# Patient Record
Sex: Female | Born: 1955 | Race: White | Hispanic: No | Marital: Married | Smoking: Never smoker
Health system: Southern US, Community
[De-identification: ages and names within clinical notes are randomized; demographics above are authoritative.]

## PROBLEM LIST (undated history)

## (undated) DIAGNOSIS — B019 Varicella without complication: Secondary | ICD-10-CM

## (undated) DIAGNOSIS — M1990 Unspecified osteoarthritis, unspecified site: Secondary | ICD-10-CM

## (undated) HISTORY — PX: MOUTH SURGERY: SHX715

## (undated) HISTORY — DX: Varicella without complication: B01.9

## (undated) HISTORY — DX: Unspecified osteoarthritis, unspecified site: M19.90

## (undated) HISTORY — PX: TUBAL LIGATION: SHX77

---

## 1997-07-03 ENCOUNTER — Other Ambulatory Visit: Admission: RE | Admit: 1997-07-03 | Discharge: 1997-07-03 | Payer: Self-pay | Admitting: *Deleted

## 1999-08-05 ENCOUNTER — Encounter: Payer: Self-pay | Admitting: *Deleted

## 1999-08-05 ENCOUNTER — Encounter: Admission: RE | Admit: 1999-08-05 | Discharge: 1999-08-05 | Payer: Self-pay | Admitting: *Deleted

## 1999-10-04 ENCOUNTER — Other Ambulatory Visit: Admission: RE | Admit: 1999-10-04 | Discharge: 1999-10-04 | Payer: Self-pay | Admitting: *Deleted

## 2000-08-09 ENCOUNTER — Encounter: Admission: RE | Admit: 2000-08-09 | Discharge: 2000-08-09 | Payer: Self-pay | Admitting: *Deleted

## 2000-08-09 ENCOUNTER — Encounter: Payer: Self-pay | Admitting: *Deleted

## 2001-01-28 ENCOUNTER — Other Ambulatory Visit: Admission: RE | Admit: 2001-01-28 | Discharge: 2001-01-28 | Payer: Self-pay | Admitting: *Deleted

## 2001-11-19 ENCOUNTER — Encounter: Admission: RE | Admit: 2001-11-19 | Discharge: 2001-11-19 | Payer: Self-pay | Admitting: *Deleted

## 2001-11-19 ENCOUNTER — Encounter: Payer: Self-pay | Admitting: *Deleted

## 2003-01-26 ENCOUNTER — Other Ambulatory Visit: Admission: RE | Admit: 2003-01-26 | Discharge: 2003-01-26 | Payer: Self-pay | Admitting: Obstetrics and Gynecology

## 2003-01-26 ENCOUNTER — Encounter: Admission: RE | Admit: 2003-01-26 | Discharge: 2003-01-26 | Payer: Self-pay | Admitting: Obstetrics and Gynecology

## 2004-04-18 ENCOUNTER — Encounter: Admission: RE | Admit: 2004-04-18 | Discharge: 2004-04-18 | Payer: Self-pay | Admitting: Obstetrics and Gynecology

## 2005-06-22 ENCOUNTER — Encounter: Admission: RE | Admit: 2005-06-22 | Discharge: 2005-06-22 | Payer: Self-pay | Admitting: Obstetrics and Gynecology

## 2006-06-27 ENCOUNTER — Encounter: Admission: RE | Admit: 2006-06-27 | Discharge: 2006-06-27 | Payer: Self-pay | Admitting: Obstetrics and Gynecology

## 2007-01-23 ENCOUNTER — Emergency Department (HOSPITAL_COMMUNITY): Admission: EM | Admit: 2007-01-23 | Discharge: 2007-01-23 | Payer: Self-pay | Admitting: Emergency Medicine

## 2007-07-01 ENCOUNTER — Encounter: Admission: RE | Admit: 2007-07-01 | Discharge: 2007-07-01 | Payer: Self-pay | Admitting: Obstetrics and Gynecology

## 2008-07-07 ENCOUNTER — Encounter: Admission: RE | Admit: 2008-07-07 | Discharge: 2008-07-07 | Payer: Self-pay | Admitting: Obstetrics and Gynecology

## 2009-07-15 ENCOUNTER — Encounter: Admission: RE | Admit: 2009-07-15 | Discharge: 2009-07-15 | Payer: Self-pay | Admitting: Obstetrics and Gynecology

## 2010-04-14 ENCOUNTER — Encounter: Payer: Self-pay | Admitting: Family Medicine

## 2010-04-25 ENCOUNTER — Encounter: Payer: Self-pay | Admitting: Family Medicine

## 2010-07-06 ENCOUNTER — Other Ambulatory Visit: Payer: Self-pay | Admitting: Obstetrics

## 2010-07-06 DIAGNOSIS — Z1231 Encounter for screening mammogram for malignant neoplasm of breast: Secondary | ICD-10-CM

## 2010-07-19 ENCOUNTER — Ambulatory Visit
Admission: RE | Admit: 2010-07-19 | Discharge: 2010-07-19 | Disposition: A | Discharge status: Home / Self Care | Payer: BC Managed Care – PPO | Source: Ambulatory Visit | Attending: Obstetrics | Admitting: Obstetrics

## 2010-07-19 DIAGNOSIS — Z1231 Encounter for screening mammogram for malignant neoplasm of breast: Secondary | ICD-10-CM

## 2010-09-29 LAB — DIFFERENTIAL
Basophils Absolute: 0.1
Basophils Relative: 1
Eosinophils Absolute: 0
Eosinophils Relative: 1
Monocytes Absolute: 0.7
Monocytes Relative: 9
Neutrophils Relative %: 65

## 2010-09-29 LAB — CBC
HCT: 41.2
MCHC: 33.6
MCV: 93.1
Platelets: 410 — ABNORMAL HIGH

## 2010-09-29 LAB — I-STAT 8, (EC8 V) (CONVERTED LAB)
Bicarbonate: 25 — ABNORMAL HIGH
HCT: 45
Operator id: 294501
Potassium: 3.3 — ABNORMAL LOW
Sodium: 138
TCO2: 27
pCO2, Ven: 51.6 — ABNORMAL HIGH
pH, Ven: 7.293

## 2011-06-19 ENCOUNTER — Other Ambulatory Visit: Payer: Self-pay | Admitting: Obstetrics

## 2011-06-19 DIAGNOSIS — Z1231 Encounter for screening mammogram for malignant neoplasm of breast: Secondary | ICD-10-CM

## 2011-07-20 ENCOUNTER — Ambulatory Visit
Admission: RE | Admit: 2011-07-20 | Discharge: 2011-07-20 | Disposition: A | Discharge status: Home / Self Care | Payer: BC Managed Care – PPO | Source: Ambulatory Visit | Attending: Obstetrics | Admitting: Obstetrics

## 2011-07-20 DIAGNOSIS — Z1231 Encounter for screening mammogram for malignant neoplasm of breast: Secondary | ICD-10-CM

## 2011-07-22 LAB — HM MAMMOGRAPHY: HM Mammogram: NEGATIVE

## 2011-11-30 ENCOUNTER — Ambulatory Visit: Payer: BC Managed Care – PPO | Admitting: Family Medicine

## 2012-01-10 HISTORY — PX: BREAST BIOPSY: SHX20

## 2012-01-22 ENCOUNTER — Ambulatory Visit (INDEPENDENT_AMBULATORY_CARE_PROVIDER_SITE_OTHER): Payer: BC Managed Care – PPO | Admitting: Family Medicine

## 2012-01-22 ENCOUNTER — Encounter: Payer: Self-pay | Admitting: Family Medicine

## 2012-01-22 VITALS — BP 120/72 | HR 72 | Temp 98.5°F | Resp 12 | Ht 63.25 in | Wt 174.0 lb

## 2012-01-22 DIAGNOSIS — I7300 Raynaud's syndrome without gangrene: Secondary | ICD-10-CM | POA: Insufficient documentation

## 2012-01-22 DIAGNOSIS — Z0000 Encounter for general adult medical examination without abnormal findings: Secondary | ICD-10-CM

## 2012-01-22 DIAGNOSIS — M47812 Spondylosis without myelopathy or radiculopathy, cervical region: Secondary | ICD-10-CM | POA: Insufficient documentation

## 2012-01-22 LAB — BASIC METABOLIC PANEL
Chloride: 104 mEq/L (ref 96–112)
Creatinine, Ser: 0.7 mg/dL (ref 0.4–1.2)
GFR: 90.35 mL/min (ref >60.00)
Sodium: 139 mEq/L (ref 135–145)

## 2012-01-22 LAB — HEPATIC FUNCTION PANEL
ALT: 13 U/L (ref 0–35)
Alkaline Phosphatase: 62 U/L (ref 39–117)
Total Bilirubin: 0.5 mg/dL (ref 0.3–1.2)

## 2012-01-22 LAB — CBC WITH DIFFERENTIAL/PLATELET
Basophils Absolute: 0.1 10*3/uL (ref 0.0–0.1)
Basophils Relative: 1.7 % (ref 0.0–3.0)
Eosinophils Absolute: 0.1 10*3/uL (ref 0.0–0.7)
Eosinophils Relative: 0.9 % (ref 0.0–5.0)
Lymphs Abs: 1.6 10*3/uL (ref 0.7–4.0)
MCV: 93.7 fl (ref 78.0–100.0)
Monocytes Relative: 10.2 % (ref 3.0–12.0)
RDW: 12.8 % (ref 11.5–14.6)

## 2012-01-22 LAB — LIPID PANEL
Cholesterol: 188 mg/dL (ref 0–200)
Total CHOL/HDL Ratio: 4
VLDL: 28.6 mg/dL (ref 0.0–40.0)

## 2012-01-22 LAB — TSH: TSH: 2.35 u[IU]/mL (ref 0.35–5.50)

## 2012-01-22 NOTE — Patient Instructions (Addendum)
Consider check on insurance coverage for shingles vaccine

## 2012-01-22 NOTE — Progress Notes (Signed)
  Subjective:    Patient ID: Donna Shaw, female    DOB: 1955-07-19, 57 y.o.   MRN: [id number]  HPI Patient here to establish care. She sees gynecologist for yearly exam. 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 Has had one month history of some right sacroiliac pains. No injury. Described as dull ache sometimes worse at night. No radiculopathy symptoms. No pain with walking. Ibuprofen helps. Denies low back pain.  Intermittent tingling sensation upper extremities intermittently. Positional and resolves after 1 minute or less with position change. No weakness.  Patient's had bilateral tubal ligation no other surgeries. She works as a Ecologist. No history of smoking. Only occasional alcohol use.  Patient has questions regarding shingles vaccine. She had chickenpox as a child.   Review of Systems  Constitutional: Negative for fever, activity change, appetite change, fatigue and unexpected weight change.  HENT: Negative for hearing loss, ear pain, sore throat and trouble swallowing.   Eyes: Negative for visual disturbance.  Respiratory: Negative for cough and shortness of breath.   Cardiovascular: Negative for chest pain and palpitations.  Gastrointestinal: Negative for abdominal pain, diarrhea, constipation and blood in stool.  Genitourinary: Negative for dysuria and hematuria.  Musculoskeletal: Negative for myalgias, back pain and arthralgias.  Skin: Negative for rash.  Neurological: Negative for dizziness, syncope and headaches.  Hematological: Negative for adenopathy.  Psychiatric/Behavioral: Negative for confusion and dysphoric mood.       Objective:   Physical Exam  Constitutional: She is oriented to person, place, and time. She appears well-developed and well-nourished.  HENT:  Head: Normocephalic and atraumatic.  Eyes: EOM are normal. Pupils are equal, round, and reactive to light.  Neck: Normal range of motion. Neck supple. No  thyromegaly present.  Cardiovascular: Normal rate, regular rhythm and normal heart sounds.   No murmur heard. Pulmonary/Chest: Breath sounds normal. No respiratory distress. She has no wheezes. She has no rales.  Abdominal: Soft. Bowel sounds are normal. She exhibits no distension and no mass. There is no tenderness. There is no rebound and no guarding.  Musculoskeletal: Normal range of motion. She exhibits no edema.  Lymphadenopathy:    She has no cervical adenopathy.  Neurological: She is alert and oriented to person, place, and time. She displays normal reflexes. No cranial nerve deficit.  Skin: No rash noted.  Psychiatric: She has a normal mood and affect. Her behavior is normal. Judgment and thought content normal.          Assessment & Plan:  Health maintenance. Obtain screening lab work. Check on insurance coverage for shingles vaccine. Continue yearly mammogram. She'll continue yearly GYN followup

## 2012-01-23 NOTE — Progress Notes (Signed)
Quick Note:  Pt informed on VM ______ 

## 2012-04-05 ENCOUNTER — Encounter: Payer: Self-pay | Admitting: Family Medicine

## 2012-04-05 ENCOUNTER — Ambulatory Visit (INDEPENDENT_AMBULATORY_CARE_PROVIDER_SITE_OTHER): Payer: BC Managed Care – PPO | Admitting: Family Medicine

## 2012-04-05 VITALS — BP 102/60 | HR 78 | Temp 98.1°F | Wt 177.0 lb

## 2012-04-05 DIAGNOSIS — H6120 Impacted cerumen, unspecified ear: Secondary | ICD-10-CM

## 2012-04-05 DIAGNOSIS — H6123 Impacted cerumen, bilateral: Secondary | ICD-10-CM

## 2012-04-05 NOTE — Progress Notes (Signed)
  Subjective:    Patient ID: Donna Shaw, female    DOB: 05/08/1955, 57 y.o.   MRN: [id number]  HPI Bilateral ear pressure. Denies any dizziness. No drainage. She's noted some bilateral hearing loss recently. No nasal congestion. She's tried irrigating years at home without improvement in the past. Denies any headaches.  Past Medical History  Diagnosis Date  . Chicken pox   . Arthritis     osteoarthritis neck   Past Surgical History  Procedure Laterality Date  . Tubal ligation      reports that she has never smoked. She does not have any smokeless tobacco history on file. Her alcohol and drug histories are not on file. family history includes Cancer in her father and mother. Allergies  Allergen Reactions  . Codeine     hives      Review of Systems  Constitutional: Negative for fever and chills.  HENT: Positive for hearing loss. Negative for ear pain, congestion and ear discharge.   Neurological: Negative for headaches.       Objective:   Physical Exam  Constitutional: She appears well-developed and well-nourished.  HENT:  Bilateral cerumen impactions. Fully removed after irrigation. Eardrums appear normal  Cardiovascular: Normal rate and regular rhythm.           Assessment & Plan:  Bilateral cerumen impactions. Removed without difficulty. Patient will followup as needed Hearing improved following removal of cerumen

## 2012-04-05 NOTE — Patient Instructions (Addendum)
Cerumen Impaction  A cerumen impaction is when the wax in your ear forms a plug. This plug usually causes reduced hearing. Sometimes it also causes an earache or dizziness. Removing a cerumen impaction can be difficult and painful. The wax sticks to the ear canal. The canal is sensitive and bleeds easily. If you try to remove a heavy wax buildup with a cotton tipped swab, you may push it in further.  Irrigation with water, suction, and small ear curettes may be used to clear out the wax. If the impaction is fixed to the skin in the ear canal, ear drops may be needed for a few days to loosen the wax. People who build up a lot of wax frequently can use ear wax removal products available in your local drugstore.  SEEK MEDICAL CARE IF:    You develop an earache, increased hearing loss, or marked dizziness.  Document Released: 02/03/2004 Document Revised: 03/20/2011 Document Reviewed: 03/25/2009  ExitCare Patient Information 2013 [employer].

## 2012-06-10 ENCOUNTER — Other Ambulatory Visit: Payer: Self-pay

## 2012-06-10 DIAGNOSIS — Z1231 Encounter for screening mammogram for malignant neoplasm of breast: Secondary | ICD-10-CM

## 2012-07-22 ENCOUNTER — Ambulatory Visit
Admission: RE | Admit: 2012-07-22 | Discharge: 2012-07-22 | Disposition: A | Discharge status: Home / Self Care | Payer: BC Managed Care – PPO | Source: Ambulatory Visit

## 2012-07-22 DIAGNOSIS — Z1231 Encounter for screening mammogram for malignant neoplasm of breast: Secondary | ICD-10-CM

## 2012-07-24 ENCOUNTER — Other Ambulatory Visit: Payer: Self-pay | Admitting: Obstetrics

## 2012-07-24 DIAGNOSIS — R928 Other abnormal and inconclusive findings on diagnostic imaging of breast: Secondary | ICD-10-CM

## 2012-08-06 ENCOUNTER — Ambulatory Visit
Admission: RE | Admit: 2012-08-06 | Discharge: 2012-08-06 | Disposition: A | Discharge status: Home / Self Care | Payer: BC Managed Care – PPO | Source: Ambulatory Visit | Attending: Obstetrics | Admitting: Obstetrics

## 2012-08-06 ENCOUNTER — Other Ambulatory Visit: Payer: Self-pay | Admitting: Obstetrics

## 2012-08-06 DIAGNOSIS — N6320 Unspecified lump in the left breast, unspecified quadrant: Secondary | ICD-10-CM

## 2012-08-06 DIAGNOSIS — R928 Other abnormal and inconclusive findings on diagnostic imaging of breast: Secondary | ICD-10-CM

## 2012-08-07 ENCOUNTER — Ambulatory Visit
Admission: RE | Admit: 2012-08-07 | Discharge: 2012-08-07 | Disposition: A | Discharge status: Home / Self Care | Payer: BC Managed Care – PPO | Source: Ambulatory Visit | Attending: Obstetrics | Admitting: Obstetrics

## 2012-08-07 ENCOUNTER — Other Ambulatory Visit: Payer: Self-pay | Admitting: Obstetrics

## 2012-08-07 DIAGNOSIS — N6320 Unspecified lump in the left breast, unspecified quadrant: Secondary | ICD-10-CM

## 2012-10-23 ENCOUNTER — Encounter: Payer: Self-pay | Admitting: Family Medicine

## 2012-10-23 ENCOUNTER — Ambulatory Visit (INDEPENDENT_AMBULATORY_CARE_PROVIDER_SITE_OTHER): Payer: BC Managed Care – PPO | Admitting: Family Medicine

## 2012-10-23 VITALS — BP 106/70 | HR 96 | Temp 98.3°F | Wt 184.0 lb

## 2012-10-23 DIAGNOSIS — Z23 Encounter for immunization: Secondary | ICD-10-CM

## 2012-10-23 DIAGNOSIS — J209 Acute bronchitis, unspecified: Secondary | ICD-10-CM

## 2012-10-23 DIAGNOSIS — Z7189 Other specified counseling: Secondary | ICD-10-CM

## 2012-10-23 DIAGNOSIS — Z7185 Encounter for immunization safety counseling: Secondary | ICD-10-CM

## 2012-10-23 MED ORDER — AZITHROMYCIN 250 MG PO TABS: ORAL_TABLET | ORAL | Status: AC

## 2012-10-23 NOTE — Progress Notes (Signed)
  Subjective:    Patient ID: Donna Shaw, female    DOB: August 28, 1955, 57 y.o.   MRN: [id number]  HPI Patient seen with respiratory illness Onset last week. She's had cough productive of green sputum. She's had some sore throat. Increased malaise. No fever. Intermittent left ear pain. Denies any nausea or vomiting. No sick contacts. She's not aware of any wheezing. She's using NyQuil for nighttime relief of coughing. No history of any asthma or reactive airway problems. Nonsmoker  Requesting flu vaccine.  Patient also has questions regarding mumps immunization status. She states in looking over her old record she cannot confirm any prior mumps vaccination. She states that she had measles clinically. She would like to consider mumps IgG level with her physical labs in January  Past Medical History  Diagnosis Date  . Chicken pox   . Arthritis     osteoarthritis neck   Past Surgical History  Procedure Laterality Date  . Tubal ligation      reports that she has never smoked. She does not have any smokeless tobacco history on file. Her alcohol and drug histories are not on file. family history includes Cancer in her father and mother. Allergies  Allergen Reactions  . Codeine     hives      Review of Systems  Constitutional: Negative for fever and chills.  HENT: Positive for sore throat. Negative for postnasal drip and sinus pressure.   Respiratory: Positive for cough. Negative for shortness of breath and wheezing.   Gastrointestinal: Negative for nausea and vomiting.       Objective:   Physical Exam  Constitutional: She appears well-developed and well-nourished.  HENT:  Right Ear: External ear normal.  Left Ear: External ear normal.  Mouth/Throat: Oropharynx is clear and moist.  Neck: Neck supple. No thyromegaly present.  Cardiovascular: Normal rate and regular rhythm.   Pulmonary/Chest: Effort normal and breath sounds normal. No respiratory distress. She has no wheezes.  She has no rales.          Assessment & Plan:  Acute bronchitis. Explained most are viral. She's had difficulty shaking in the past. Start Zithromax. Continue over-the-counter cough medicines as needed. Patient requesting flu vaccine and since afebrile this is given  We'll check Mumps IgG when she comes in for physical labs in January

## 2012-10-23 NOTE — Patient Instructions (Signed)

## 2013-03-21 ENCOUNTER — Encounter: Payer: Self-pay | Admitting: Family Medicine

## 2013-03-21 ENCOUNTER — Ambulatory Visit (INDEPENDENT_AMBULATORY_CARE_PROVIDER_SITE_OTHER): Payer: BC Managed Care – PPO | Admitting: Family Medicine

## 2013-03-21 VITALS — BP 118/80 | HR 74 | Temp 98.5°F | Ht 63.0 in | Wt 183.0 lb

## 2013-03-21 DIAGNOSIS — Z0000 Encounter for general adult medical examination without abnormal findings: Secondary | ICD-10-CM

## 2013-03-21 DIAGNOSIS — Z23 Encounter for immunization: Secondary | ICD-10-CM

## 2013-03-21 DIAGNOSIS — Z2911 Encounter for prophylactic immunotherapy for respiratory syncytial virus (RSV): Secondary | ICD-10-CM

## 2013-03-21 LAB — CBC WITH DIFFERENTIAL/PLATELET
BASOS PCT: 1.2 % (ref 0.0–3.0)
Basophils Absolute: 0.1 10*3/uL (ref 0.0–0.1)
EOS ABS: 0.1 10*3/uL (ref 0.0–0.7)
EOS PCT: 1.6 % (ref 0.0–5.0)
HEMATOCRIT: 40 % (ref 36.0–46.0)
Hemoglobin: 13.5 g/dL (ref 12.0–15.0)
LYMPHS ABS: 1.5 10*3/uL (ref 0.7–4.0)
Lymphocytes Relative: 34.2 % (ref 12.0–46.0)
MCHC: 33.7 g/dL (ref 30.0–36.0)
MCV: 94 fl (ref 78.0–100.0)
MONO ABS: 0.4 10*3/uL (ref 0.1–1.0)
Monocytes Relative: 10.2 % (ref 3.0–12.0)
Neutro Abs: 2.3 10*3/uL (ref 1.4–7.7)
Neutrophils Relative %: 52.8 % (ref 43.0–77.0)
PLATELETS: 414 10*3/uL — AB (ref 150.0–400.0)
RBC: 4.26 Mil/uL (ref 3.87–5.11)
RDW: 12.7 % (ref 11.5–14.6)
WBC: 4.4 10*3/uL — AB (ref 4.5–10.5)

## 2013-03-21 LAB — HEPATIC FUNCTION PANEL
ALBUMIN: 4.2 g/dL (ref 3.5–5.2)
ALT: 19 U/L (ref 0–35)
AST: 22 U/L (ref 0–37)
Alkaline Phosphatase: 70 U/L (ref 39–117)
Bilirubin, Direct: 0.1 mg/dL (ref 0.0–0.3)
Total Bilirubin: 0.6 mg/dL (ref 0.3–1.2)
Total Protein: 7 g/dL (ref 6.0–8.3)

## 2013-03-21 LAB — BASIC METABOLIC PANEL
BUN: 16 mg/dL (ref 6–23)
CALCIUM: 8.9 mg/dL (ref 8.4–10.5)
CO2: 27 mEq/L (ref 19–32)
Chloride: 104 mEq/L (ref 96–112)
Creatinine, Ser: 0.7 mg/dL (ref 0.4–1.2)
GFR: 87.14 mL/min (ref >60.00)
GLUCOSE: 84 mg/dL (ref 70–99)
POTASSIUM: 3.8 meq/L (ref 3.5–5.1)
SODIUM: 138 meq/L (ref 135–145)

## 2013-03-21 LAB — TSH: TSH: 2.15 u[IU]/mL (ref 0.35–5.50)

## 2013-03-21 LAB — LIPID PANEL
CHOLESTEROL: 210 mg/dL — AB (ref 0–200)
HDL: 48.1 mg/dL (ref >39.00)
LDL Cholesterol: 142 mg/dL — ABNORMAL HIGH (ref 0–99)
Total CHOL/HDL Ratio: 4
Triglycerides: 101 mg/dL (ref 0.0–149.0)
VLDL: 20.2 mg/dL (ref 0.0–40.0)

## 2013-03-21 NOTE — Addendum Note (Signed)
Addended by: Thomasena EdisFLOYD, Edoardo Laforte E on: 03/21/2013 09:37 AM   Modules accepted: Orders

## 2013-03-21 NOTE — Patient Instructions (Signed)
Gastroesophageal Reflux Disease, Adult  Gastroesophageal reflux disease (GERD) happens when acid from your stomach flows up into the esophagus. When acid comes in contact with the esophagus, the acid causes soreness (inflammation) in the esophagus. Over time, GERD may create small holes (ulcers) in the lining of the esophagus.  CAUSES   · Increased body weight. This puts pressure on the stomach, making acid rise from the stomach into the esophagus.  · Smoking. This increases acid production in the stomach.  · Drinking alcohol. This causes decreased pressure in the lower esophageal sphincter (valve or ring of muscle between the esophagus and stomach), allowing acid from the stomach into the esophagus.  · Late evening meals and a full stomach. This increases pressure and acid production in the stomach.  · A malformed lower esophageal sphincter.  Sometimes, no cause is found.  SYMPTOMS   · Burning pain in the lower part of the mid-chest behind the breastbone and in the mid-stomach area. This may occur twice a week or more often.  · Trouble swallowing.  · Sore throat.  · Dry cough.  · Asthma-like symptoms including chest tightness, shortness of breath, or wheezing.  DIAGNOSIS   Your caregiver may be able to diagnose GERD based on your symptoms. In some cases, X-rays and other tests may be done to check for complications or to check the condition of your stomach and esophagus.  TREATMENT   Your caregiver may recommend over-the-counter or prescription medicines to help decrease acid production. Ask your caregiver before starting or adding any new medicines.   HOME CARE INSTRUCTIONS   · Change the factors that you can control. Ask your caregiver for guidance concerning weight loss, quitting smoking, and alcohol consumption.  · Avoid foods and drinks that make your symptoms worse, such as:  · Caffeine or alcoholic drinks.  · Chocolate.  · Peppermint or mint flavorings.  · Garlic and onions.  · Spicy foods.  · Citrus fruits,  such as oranges, lemons, or limes.  · Tomato-based foods such as sauce, chili, salsa, and pizza.  · Fried and fatty foods.  · Avoid lying down for the 3 hours prior to your bedtime or prior to taking a nap.  · Eat small, frequent meals instead of large meals.  · Wear loose-fitting clothing. Do not wear anything tight around your waist that causes pressure on your stomach.  · Raise the head of your bed 6 to 8 inches with wood blocks to help you sleep. Extra pillows will not help.  · Only take over-the-counter or prescription medicines for pain, discomfort, or fever as directed by your caregiver.  · Do not take aspirin, ibuprofen, or other nonsteroidal anti-inflammatory drugs (NSAIDs).  SEEK IMMEDIATE MEDICAL CARE IF:   · You have pain in your arms, neck, jaw, teeth, or back.  · Your pain increases or changes in intensity or duration.  · You develop nausea, vomiting, or sweating (diaphoresis).  · You develop shortness of breath, or you faint.  · Your vomit is green, yellow, black, or looks like coffee grounds or blood.  · Your stool is red, bloody, or black.  These symptoms could be signs of other problems, such as heart disease, gastric bleeding, or esophageal bleeding.  MAKE SURE YOU:   · Understand these instructions.  · Will watch your condition.  · Will get help right away if you are not doing well or get worse.  Document Released: 10/05/2004 Document Revised: 03/20/2011 Document Reviewed: 07/15/2010  ExitCare® Patient   Information ©2014 [employer].

## 2013-03-21 NOTE — Progress Notes (Signed)
   Subjective:    Patient ID: Donna Shaw, female    DOB: 1955/02/27, 58 y.o.   MRN: [id number]  HPI Patient seen for complete physical. She sees gynecologist regularly. She takes no regular medications. She's had some ongoing problems with arthritis in her neck and plans to see chiropractor soon. She's recently had some heartburn issues. She does drink excessive caffeine. She has not had any dysphagia. No appetite or weight changes. Patient is requesting shingles vaccine. She also requests mumps IgG antibodies. She does not recall having mumps or vaccination previously.  Patient is a nonsmoker. She exercises with walking. Tetanus is up-to-date. She had colonoscopy 5 years ago and plans to get repeat next year.  Past Medical History  Diagnosis Date  . Chicken pox   . Arthritis     osteoarthritis neck   Past Surgical History  Procedure Laterality Date  . Tubal ligation      reports that she has never smoked. She does not have any smokeless tobacco history on file. Her alcohol and drug histories are not on file. family history includes Cancer in her father and mother. Allergies  Allergen Reactions  . Codeine     hives      Review of Systems  Constitutional: Negative for fever, activity change, appetite change, fatigue and unexpected weight change.  HENT: Negative for ear pain, hearing loss, sore throat and trouble swallowing.   Eyes: Negative for visual disturbance.  Respiratory: Negative for cough and shortness of breath.   Cardiovascular: Negative for chest pain and palpitations.  Gastrointestinal: Negative for nausea, vomiting, abdominal pain, diarrhea, constipation and blood in stool.  Endocrine: Negative for polydipsia and polyuria.  Genitourinary: Negative for dysuria and hematuria.  Musculoskeletal: Positive for neck pain. Negative for arthralgias, back pain, myalgias and neck stiffness.  Skin: Negative for rash.  Neurological: Negative for dizziness, syncope and  headaches.  Hematological: Negative for adenopathy.  Psychiatric/Behavioral: Negative for confusion and dysphoric mood.       Objective:   Physical Exam  Constitutional: She is oriented to person, place, and time. She appears well-developed and well-nourished.  HENT:  Right Ear: External ear normal.  Left Ear: External ear normal.  Mouth/Throat: Oropharynx is clear and moist.  Neck: Neck supple. No thyromegaly present.  Cardiovascular: Normal rate and regular rhythm.   Pulmonary/Chest: Effort normal and breath sounds normal. No respiratory distress. She has no wheezes. She has no rales.  Abdominal: Soft. Bowel sounds are normal. She exhibits no distension and no mass. There is no tenderness. There is no rebound and no guarding.  Genitourinary:  Per GYN  Musculoskeletal: She exhibits no edema.  Lymphadenopathy:    She has no cervical adenopathy.  Neurological: She is alert and oriented to person, place, and time. No cranial nerve deficit.  Psychiatric: She has a normal mood and affect. Her behavior is normal. Judgment and thought content normal.          Assessment & Plan:  Complete physical. She will continue GYN followup. Shingles vaccine given per her request. Labs obtained. Include mumps IgG antibody. Handout on GERD given. Continue weightbearing exercise. Repeat colonoscopy by next year

## 2013-03-21 NOTE — Progress Notes (Signed)
Pre visit review using our clinic review tool, if applicable. No additional management support is needed unless otherwise documented below in the visit note. 

## 2013-03-22 LAB — MUMPS ANTIBODY, IGG

## 2013-06-30 ENCOUNTER — Other Ambulatory Visit: Payer: Self-pay

## 2013-06-30 DIAGNOSIS — Z1231 Encounter for screening mammogram for malignant neoplasm of breast: Secondary | ICD-10-CM

## 2013-07-24 ENCOUNTER — Encounter (INDEPENDENT_AMBULATORY_CARE_PROVIDER_SITE_OTHER): Payer: Self-pay

## 2013-07-24 ENCOUNTER — Ambulatory Visit
Admission: RE | Admit: 2013-07-24 | Discharge: 2013-07-24 | Disposition: A | Discharge status: Home / Self Care | Payer: BC Managed Care – PPO | Source: Ambulatory Visit

## 2013-07-24 DIAGNOSIS — Z1231 Encounter for screening mammogram for malignant neoplasm of breast: Secondary | ICD-10-CM

## 2013-08-25 ENCOUNTER — Encounter: Payer: Self-pay | Admitting: Family Medicine

## 2013-08-25 ENCOUNTER — Ambulatory Visit (INDEPENDENT_AMBULATORY_CARE_PROVIDER_SITE_OTHER): Payer: BC Managed Care – PPO | Admitting: Family Medicine

## 2013-08-25 ENCOUNTER — Ambulatory Visit (INDEPENDENT_AMBULATORY_CARE_PROVIDER_SITE_OTHER)
Admission: RE | Admit: 2013-08-25 | Discharge: 2013-08-25 | Disposition: A | Discharge status: Home / Self Care | Payer: BC Managed Care – PPO | Source: Ambulatory Visit | Attending: Family Medicine | Admitting: Family Medicine

## 2013-08-25 VITALS — BP 110/80 | HR 76 | Temp 98.1°F | Wt 182.0 lb

## 2013-08-25 DIAGNOSIS — R053 Chronic cough: Secondary | ICD-10-CM

## 2013-08-25 DIAGNOSIS — R05 Cough: Secondary | ICD-10-CM

## 2013-08-25 DIAGNOSIS — H9209 Otalgia, unspecified ear: Secondary | ICD-10-CM

## 2013-08-25 DIAGNOSIS — H9201 Otalgia, right ear: Secondary | ICD-10-CM

## 2013-08-25 DIAGNOSIS — R059 Cough, unspecified: Secondary | ICD-10-CM

## 2013-08-25 NOTE — Progress Notes (Signed)
   Subjective:    Patient ID: Donna PatSusan W Shaw, female    DOB: 02/26/1955, 58 y.o.   MRN: [id number]  Otalgia  Associated symptoms include coughing. Pertinent negatives include no headaches, rhinorrhea or sore throat.   Patient seen today for the following issues  Acute new problem of right earache off and on for a month. No ear drainage. No hearing changes. No sinus congestive symptoms. No clear exacerbating or alleviating factors. Symptoms relatively mild and off and on. Denies any sore throat. No pain with eating  Patient relates dry cough for several months. In fact, onset around last fall. Never smoked. No appetite or weight changes. No dyspnea. No hemoptysis. Cough is relatively mild. No ACE inhibitor use. Possibly some wheezing off and on. Question of GERD symptoms off and on. Frequently clear his throat. No postnasal drip symptoms. No history of asthma. She has taken occasional H2 blocker but inconsistently.  Past Medical History  Diagnosis Date  . Chicken pox   . Arthritis     osteoarthritis neck   Past Surgical History  Procedure Laterality Date  . Tubal ligation      reports that she has never smoked. She does not have any smokeless tobacco history on file. Her alcohol and drug histories are not on file. family history includes Cancer in her father and mother. Allergies  Allergen Reactions  . Codeine     hives      Review of Systems  Constitutional: Negative for fever, chills, appetite change and unexpected weight change.  HENT: Positive for ear pain. Negative for congestion, postnasal drip, rhinorrhea, sneezing and sore throat.   Respiratory: Positive for cough. Negative for shortness of breath.   Cardiovascular: Negative for chest pain, palpitations and leg swelling.  Neurological: Negative for dizziness and headaches.       Objective:   Physical Exam  Constitutional: She appears well-developed and well-nourished.  HENT:  Right Ear: External ear normal.  Left  Ear: External ear normal.  Mouth/Throat: Oropharynx is clear and moist.  Minimal right TMJ tenderness. No facial mass  Neck: Neck supple.  Cardiovascular: Normal rate and regular rhythm.   Pulmonary/Chest: Effort normal and breath sounds normal. No respiratory distress. She has no wheezes. She has no rales.  Lymphadenopathy:    She has no cervical adenopathy.          Assessment & Plan:  #1 right otalgia. Normal exam. Observe for now as symptoms are relatively mild. She does not have any red flags such as hearing changes or unilateral tinnitus. #2 chronic cough. Nonfocal exam. Question GERD related. Given duration, chest x-ray. Elevate head of bed 6-8 inches. Avoid eating within 2-3 hours of bedtime. Recommend trial of over-the-counter omeprazole or Nexium and touch base one month if cough not resolving

## 2013-08-25 NOTE — Patient Instructions (Signed)
Consider elevate head of bed 6-8 inches Consider trial of Prilosec or Nexium. Avoid eating within 2-3 hours of bedtime     Gastroesophageal Reflux Disease, Adult Gastroesophageal reflux disease (GERD) happens when acid from your stomach flows up into the esophagus. When acid comes in contact with the esophagus, the acid causes soreness (inflammation) in the esophagus. Over time, GERD may create small holes (ulcers) in the lining of the esophagus. CAUSES   Increased body weight. This puts pressure on the stomach, making acid rise from the stomach into the esophagus.  Smoking. This increases acid production in the stomach.  Drinking alcohol. This causes decreased pressure in the lower esophageal sphincter (valve or ring of muscle between the esophagus and stomach), allowing acid from the stomach into the esophagus.  Late evening meals and a full stomach. This increases pressure and acid production in the stomach.  A malformed lower esophageal sphincter. Sometimes, no cause is found. SYMPTOMS   Burning pain in the lower part of the mid-chest behind the breastbone and in the mid-stomach area. This may occur twice a week or more often.  Trouble swallowing.  Sore throat.  Dry cough.  Asthma-like symptoms including chest tightness, shortness of breath, or wheezing. DIAGNOSIS  Your caregiver may be able to diagnose GERD based on your symptoms. In some cases, X-rays and other tests may be done to check for complications or to check the condition of your stomach and esophagus. TREATMENT  Your caregiver may recommend over-the-counter or prescription medicines to help decrease acid production. Ask your caregiver before starting or adding any new medicines.  HOME CARE INSTRUCTIONS   Change the factors that you can control. Ask your caregiver for guidance concerning weight loss, quitting smoking, and alcohol consumption.  Avoid foods and drinks that make your symptoms worse, such  as:  Caffeine or alcoholic drinks.  Chocolate.  Peppermint or mint flavorings.  Garlic and onions.  Spicy foods.  Citrus fruits, such as oranges, lemons, or limes.  Tomato-based foods such as sauce, chili, salsa, and pizza.  Fried and fatty foods.  Avoid lying down for the 3 hours prior to your bedtime or prior to taking a nap.  Eat small, frequent meals instead of large meals.  Wear loose-fitting clothing. Do not wear anything tight around your waist that causes pressure on your stomach.  Raise the head of your bed 6 to 8 inches with wood blocks to help you sleep. Extra pillows will not help.  Only take over-the-counter or prescription medicines for pain, discomfort, or fever as directed by your caregiver.  Do not take aspirin, ibuprofen, or other nonsteroidal anti-inflammatory drugs (NSAIDs). SEEK IMMEDIATE MEDICAL CARE IF:   You have pain in your arms, neck, jaw, teeth, or back.  Your pain increases or changes in intensity or duration.  You develop nausea, vomiting, or sweating (diaphoresis).  You develop shortness of breath, or you faint.  Your vomit is green, yellow, black, or looks like coffee grounds or blood.  Your stool is red, bloody, or black. These symptoms could be signs of other problems, such as heart disease, gastric bleeding, or esophageal bleeding. MAKE SURE YOU:   Understand these instructions.  Will watch your condition.  Will get help right away if you are not doing well or get worse. Document Released: 10/05/2004 Document Revised: 03/20/2011 Document Reviewed: 07/15/2010 [facility] Patient Information 2015 [employer] CityExitCare, [place]LL[place]. This information is not intended to replace advice given to you by your health care provider. Make sure you discuss any  questions you have with your health care provider.  

## 2013-08-25 NOTE — Progress Notes (Signed)
Pre visit review using our clinic review tool, if applicable. No additional management support is needed unless otherwise documented below in the visit note. 

## 2013-12-09 ENCOUNTER — Telehealth: Payer: Self-pay | Admitting: Family Medicine

## 2013-12-09 NOTE — Telephone Encounter (Signed)
Pt last visit 08/25/13

## 2013-12-09 NOTE — Telephone Encounter (Signed)
We do not advise calling in antibiotics without assessment.  Could she see someone near end of day?

## 2013-12-09 NOTE — Telephone Encounter (Signed)
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 Could not find an appt to meet her schedule. Pt has poss sinus inf. Pt has cough, sore throat, yellow green mucus. Would like to know if dr will call in antibiotic. [facility]

## 2013-12-10 NOTE — Telephone Encounter (Signed)
Lm on vm to cb and sch appt °

## 2014-04-22 ENCOUNTER — Ambulatory Visit (INDEPENDENT_AMBULATORY_CARE_PROVIDER_SITE_OTHER): Payer: BC Managed Care – PPO | Admitting: Family Medicine

## 2014-04-22 ENCOUNTER — Encounter: Payer: Self-pay | Admitting: Family Medicine

## 2014-04-22 VITALS — BP 110/78 | HR 58 | Temp 98.0°F | Ht 62.99 in | Wt 171.0 lb

## 2014-04-22 DIAGNOSIS — Z0000 Encounter for general adult medical examination without abnormal findings: Secondary | ICD-10-CM | POA: Diagnosis not present

## 2014-04-22 DIAGNOSIS — M75101 Unspecified rotator cuff tear or rupture of right shoulder, not specified as traumatic: Secondary | ICD-10-CM

## 2014-04-22 DIAGNOSIS — I878 Other specified disorders of veins: Secondary | ICD-10-CM

## 2014-04-22 LAB — BASIC METABOLIC PANEL
BUN: 16 mg/dL (ref 6–23)
CALCIUM: 9.5 mg/dL (ref 8.4–10.5)
CHLORIDE: 105 meq/L (ref 96–112)
CO2: 28 mEq/L (ref 19–32)
CREATININE: 0.79 mg/dL (ref 0.40–1.20)
GFR: 79.25 mL/min (ref >60.00)
Glucose, Bld: 89 mg/dL (ref 70–99)
Potassium: 4.3 mEq/L (ref 3.5–5.1)
Sodium: 139 mEq/L (ref 135–145)

## 2014-04-22 LAB — LIPID PANEL
CHOL/HDL RATIO: 4
CHOLESTEROL: 204 mg/dL — AB (ref 0–200)
HDL: 57.8 mg/dL (ref >39.00)
LDL CALC: 130 mg/dL — AB (ref 0–99)
NONHDL: 146.2
Triglycerides: 79 mg/dL (ref 0.0–149.0)
VLDL: 15.8 mg/dL (ref 0.0–40.0)

## 2014-04-22 LAB — HEPATIC FUNCTION PANEL
ALBUMIN: 4.2 g/dL (ref 3.5–5.2)
ALK PHOS: 74 U/L (ref 39–117)
ALT: 12 U/L (ref 0–35)
AST: 19 U/L (ref 0–37)
BILIRUBIN DIRECT: 0 mg/dL (ref 0.0–0.3)
BILIRUBIN TOTAL: 0.4 mg/dL (ref 0.2–1.2)
Total Protein: 7.3 g/dL (ref 6.0–8.3)

## 2014-04-22 LAB — CBC WITH DIFFERENTIAL/PLATELET
BASOS PCT: 1.1 % (ref 0.0–3.0)
Basophils Absolute: 0.1 10*3/uL (ref 0.0–0.1)
EOS PCT: 1 % (ref 0.0–5.0)
Eosinophils Absolute: 0.1 10*3/uL (ref 0.0–0.7)
HEMATOCRIT: 39.9 % (ref 36.0–46.0)
Hemoglobin: 13.5 g/dL (ref 12.0–15.0)
LYMPHS ABS: 1.5 10*3/uL (ref 0.7–4.0)
Lymphocytes Relative: 29.6 % (ref 12.0–46.0)
MCHC: 33.7 g/dL (ref 30.0–36.0)
MCV: 92.9 fl (ref 78.0–100.0)
MONOS PCT: 10.8 % (ref 3.0–12.0)
Monocytes Absolute: 0.5 10*3/uL (ref 0.1–1.0)
Neutro Abs: 2.9 10*3/uL (ref 1.4–7.7)
Neutrophils Relative %: 57.5 % (ref 43.0–77.0)
PLATELETS: 410 10*3/uL — AB (ref 150.0–400.0)
RBC: 4.3 Mil/uL (ref 3.87–5.11)
RDW: 13.1 % (ref 11.5–15.5)
WBC: 5.1 10*3/uL (ref 4.0–10.5)

## 2014-04-22 LAB — TSH: TSH: 2.36 u[IU]/mL (ref 0.35–4.50)

## 2014-04-22 LAB — VITAMIN D 25 HYDROXY (VIT D DEFICIENCY, FRACTURES): VITD: 29.5 ng/mL — ABNORMAL LOW (ref 30.00–100.00)

## 2014-04-22 NOTE — Patient Instructions (Signed)

## 2014-04-22 NOTE — Progress Notes (Signed)
Pre visit review using our clinic review tool, if applicable. No additional management support is needed unless otherwise documented below in the visit note. 

## 2014-04-22 NOTE — Progress Notes (Signed)
Subjective:    Patient ID: Donna Shaw, female    DOB: 09/20/1955, 59 y.o.   MRN: [id number]  HPI Patient seen for complete physical. She continues to gynecologist regularly. Her father had colon cancer. She is due for repeat colonoscopy and plans to set this up herself. Immunizations up-to-date. Pap smears and mammograms are up-to-date.  She has issues of some chronic ankle edema. She has varicose veins left lower extremity greater than right. She is on her feet much of the day. Symptoms gradually worsening with age. No dyspnea.  Right shoulder pain. No injury. Somewhat progressive over several weeks. Pain with external rotation and somewhat internal rotation. No neck pain. No weakness. No repetitive use of right shoulder. Starting to be more painful at night.  Past Medical History  Diagnosis Date  . Chicken pox   . Arthritis     osteoarthritis neck   Past Surgical History  Procedure Laterality Date  . Tubal ligation      reports that she has never smoked. She does not have any smokeless tobacco history on file. Her alcohol and drug histories are not on file. family history includes Cancer in her father and mother. Allergies  Allergen Reactions  . Codeine     hives      Review of Systems  Constitutional: Negative for fever, activity change, appetite change, fatigue and unexpected weight change.  HENT: Negative for ear pain, hearing loss, sore throat and trouble swallowing.   Eyes: Negative for visual disturbance.  Respiratory: Negative for cough and shortness of breath.   Cardiovascular: Positive for leg swelling. Negative for chest pain and palpitations.  Gastrointestinal: Negative for abdominal pain, diarrhea, constipation and blood in stool.  Genitourinary: Negative for dysuria and hematuria.  Musculoskeletal: Negative for myalgias, back pain and arthralgias.  Skin: Negative for rash.  Neurological: Negative for dizziness, syncope and headaches.  Hematological:  Negative for adenopathy.  Psychiatric/Behavioral: Negative for confusion and dysphoric mood.       Objective:   Physical Exam  Constitutional: She is oriented to person, place, and time. She appears well-developed and well-nourished.  HENT:  Head: Normocephalic and atraumatic.  Eyes: EOM are normal. Pupils are equal, round, and reactive to light.  Neck: Normal range of motion. Neck supple. No thyromegaly present.  Cardiovascular: Normal rate, regular rhythm and normal heart sounds.   No murmur heard. Pulmonary/Chest: Breath sounds normal. No respiratory distress. She has no wheezes. She has no rales.  Abdominal: Soft. Bowel sounds are normal. She exhibits no distension and no mass. There is no tenderness. There is no rebound and no guarding.  Musculoskeletal: Normal range of motion. She exhibits no edema.  Right shoulder full range of motion. She has some pain with external rotation and internal rotation. No definite weakness. No biceps tenderness. No triceps tenderness.  Lymphadenopathy:    She has no cervical adenopathy.  Neurological: She is alert and oriented to person, place, and time. She displays normal reflexes. No cranial nerve deficit.  No upper extremity weakness.  Symmetric reflexes.  Skin: No rash noted.  She has some varicosities left lower extremity greater than right. No pitting edema  Psychiatric: She has a normal mood and affect. Her behavior is normal. Judgment and thought content normal.          Assessment & Plan:  #1 complete physical. Labs obtained. Patient will schedule repeat colonoscopy. Immunizations up-to-date. Continue yearly flu vaccine.  Recommend lose some weight. #2 venous stasis lower extremities. Recommend compression  stockings with prescription written. #3 right rotator cuff syndrome. No definite weakness. We gave her handout on stretches to avoid adhesive capsulitis. Offered corticosteroid injection and she wishes to wait. Recommend icing after  activities. She was given theraband and some strengthening exercises for her rotator cuff.

## 2014-09-28 ENCOUNTER — Ambulatory Visit: Payer: BC Managed Care – PPO | Admitting: Family Medicine

## 2015-03-09 ENCOUNTER — Encounter: Payer: Self-pay | Admitting: Adult Health

## 2015-03-09 ENCOUNTER — Ambulatory Visit (INDEPENDENT_AMBULATORY_CARE_PROVIDER_SITE_OTHER): Payer: BC Managed Care – PPO | Admitting: Adult Health

## 2015-03-09 VITALS — BP 98/68 | HR 118 | Temp 100.0°F | Ht 62.99 in | Wt 174.8 lb

## 2015-03-09 DIAGNOSIS — R509 Fever, unspecified: Secondary | ICD-10-CM

## 2015-03-09 LAB — POCT INFLUENZA A/B
Influenza A, POC: POSITIVE — AB
Influenza B, POC: POSITIVE — AB

## 2015-03-09 NOTE — Patient Instructions (Addendum)
It was great meeting you today!  Your flu swab is positive and you are outside the window to take Tamiflu.   Stay well hydrated, rest and take Ibuprofen or Tylenol every 8 hours for the next 3 days.     Influenza, Adult Influenza ("the flu") is a viral infection of the respiratory tract. It occurs more often in winter months because people spend more time in close contact with one another. Influenza can make you feel very sick. Influenza easily spreads from person to person (contagious). CAUSES  Influenza is caused by a virus that infects the respiratory tract. You can catch the virus by breathing in droplets from an infected person's cough or sneeze. You can also catch the virus by touching something that was recently contaminated with the virus and then touching your mouth, nose, or eyes. RISKS AND COMPLICATIONS You may be at risk for a more severe case of influenza if you smoke cigarettes, have diabetes, have chronic heart disease (such as heart failure) or lung disease (such as asthma), or if you have a weakened immune system. Elderly people and pregnant women are also at risk for more serious infections. The most common problem of influenza is a lung infection (pneumonia). Sometimes, this problem can require emergency medical care and may be life threatening. SIGNS AND SYMPTOMS  Symptoms typically last 4 to 10 days and may include:  Fever.  Chills.  Headache, body aches, and muscle aches.  Sore throat.  Chest discomfort and cough.  Poor appetite.  Weakness or feeling tired.  Dizziness.  Nausea or vomiting. DIAGNOSIS  Diagnosis of influenza is often made based on your history and a physical exam. A nose or throat swab test can be done to confirm the diagnosis. TREATMENT  In mild cases, influenza goes away on its own. Treatment is directed at relieving symptoms. For more severe cases, your health care provider may prescribe antiviral medicines to shorten the sickness.  Antibiotic medicines are not effective because the infection is caused by a virus, not by bacteria. HOME CARE INSTRUCTIONS  Take medicines only as directed by your health care provider.  Use a cool mist humidifier to make breathing easier.  Get plenty of rest until your temperature returns to normal. This usually takes 3 to 4 days.  Drink enough fluid to keep your urine clear or pale yellow.  Cover yourmouth and nosewhen coughing or sneezing,and wash your handswellto prevent thevirusfrom spreading.  Stay homefromwork orschool untilthe fever is gonefor at least 22full day. PREVENTION  An annual influenza vaccination (flu shot) is the best way to avoid getting influenza. An annual flu shot is now routinely recommended for all adults in the [place] SEEK MEDICAL CARE IF:  You experiencechest pain, yourcough worsens,or you producemore mucus.  Youhave nausea,vomiting, ordiarrhea.  Your fever returns or gets worse. SEEK IMMEDIATE MEDICAL CARE IF:  You havetrouble breathing, you become short of breath,or your skin ornails becomebluish.  You have severe painor stiffnessin the neck.  You develop a sudden headache, or pain in the face or ear.  You have nausea or vomiting that you cannot control. MAKE SURE YOU:   Understand these instructions.  Will watch your condition.  Will get help right away if you are not doing well or get worse.   This information is not intended to replace advice given to you by your health care provider. Make sure you discuss any questions you have with your health care provider.   Document Released: 12/24/1999 Document Revised: 01/16/2014 Document  Reviewed: 03/27/2011 Elsevier Interactive Patient Education [employer].

## 2015-03-09 NOTE — Progress Notes (Signed)
   Subjective:    Patient ID: Donna Shaw, female    DOB: Sep 18, 1955, 60 y.o.   MRN: [id number]  HPI  60 year old female who presents to the office today with flu like symptoms. Her symptoms started on Friday afternoon with dry cough, fatigue, congestion, subjective fevers, chills, headaches, and body aches.   She is using Nyquil OTC - which has helped her get to sleep.   Review of Systems  Constitutional: Positive for fever, chills, diaphoresis, activity change, appetite change and fatigue.  HENT: Positive for congestion, postnasal drip, rhinorrhea and sore throat. Negative for ear discharge, ear pain, sinus pressure and trouble swallowing.   Eyes: Negative.   Respiratory: Positive for cough. Negative for shortness of breath and wheezing.   Cardiovascular: Negative.   Musculoskeletal: Positive for myalgias.  Neurological: Positive for headaches.  Hematological: Positive for adenopathy.  All other systems reviewed and are negative.      Objective:   Physical Exam  Constitutional: She is oriented to person, place, and time. She appears well-developed and well-nourished. No distress.  HENT:  Head: Normocephalic and atraumatic.  Right Ear: External ear normal.  Left Ear: External ear normal.  Nose: Nose normal.  Mouth/Throat: Oropharynx is clear and moist. No oropharyngeal exudate.  Eyes: Conjunctivae and EOM are normal. Pupils are equal, round, and reactive to light. Right eye exhibits no discharge. Left eye exhibits no discharge. No scleral icterus.  Neck: Normal range of motion. Neck supple. No JVD present. No thyromegaly present.  Cardiovascular: Normal rate, regular rhythm, normal heart sounds and intact distal pulses.  Exam reveals no gallop and no friction rub.   No murmur heard. Pulmonary/Chest: Effort normal and breath sounds normal. No respiratory distress. She has no rales. She exhibits no tenderness.  Lymphadenopathy:    She has no cervical adenopathy.    Neurological: She is alert and oriented to person, place, and time.  Skin: Skin is warm. No rash noted. She is diaphoretic. No erythema. No pallor.  Psychiatric: She has a normal mood and affect. Her behavior is normal. Judgment and thought content normal.  Nursing note and vitals reviewed.      Assessment & Plan:  1. Fever, unspecified fever cause - POC Influenza A/B- Positive - She is outside the window for Tamiflu.  - Stay hydrated, get plenty of rest. Take tylenol or ibuprofen every 8 hours for the next 3 days.

## 2015-03-09 NOTE — Progress Notes (Signed)
Pre visit review using our clinic review tool, if applicable. No additional management support is needed unless otherwise documented below in the visit note. 

## 2015-07-09 LAB — HM COLONOSCOPY

## 2015-07-29 LAB — HM MAMMOGRAPHY: HM Mammogram: NORMAL (ref 0–4)

## 2015-07-29 LAB — HM PAP SMEAR: HM Pap smear: NORMAL

## 2015-09-21 ENCOUNTER — Other Ambulatory Visit (INDEPENDENT_AMBULATORY_CARE_PROVIDER_SITE_OTHER): Payer: BC Managed Care – PPO

## 2015-09-21 DIAGNOSIS — Z0000 Encounter for general adult medical examination without abnormal findings: Secondary | ICD-10-CM

## 2015-09-21 LAB — CBC WITH DIFFERENTIAL/PLATELET
Basophils Absolute: 0.1 10*3/uL (ref 0.0–0.1)
Basophils Relative: 1.2 % (ref 0.0–3.0)
EOS PCT: 1.8 % (ref 0.0–5.0)
Eosinophils Absolute: 0.1 10*3/uL (ref 0.0–0.7)
HCT: 38 % (ref 36.0–46.0)
Hemoglobin: 13.1 g/dL (ref 12.0–15.0)
LYMPHS ABS: 1.4 10*3/uL (ref 0.7–4.0)
Lymphocytes Relative: 27.2 % (ref 12.0–46.0)
MCHC: 34.5 g/dL (ref 30.0–36.0)
MCV: 92 fl (ref 78.0–100.0)
MONOS PCT: 9.6 % (ref 3.0–12.0)
Monocytes Absolute: 0.5 10*3/uL (ref 0.1–1.0)
NEUTROS ABS: 3.1 10*3/uL (ref 1.4–7.7)
NEUTROS PCT: 60.2 % (ref 43.0–77.0)
PLATELETS: 376 10*3/uL (ref 150.0–400.0)
RBC: 4.13 Mil/uL (ref 3.87–5.11)
RDW: 12.6 % (ref 11.5–15.5)
WBC: 5.2 10*3/uL (ref 4.0–10.5)

## 2015-09-21 LAB — HEPATIC FUNCTION PANEL
ALBUMIN: 4 g/dL (ref 3.5–5.2)
ALT: 12 U/L (ref 0–35)
AST: 17 U/L (ref 0–37)
Alkaline Phosphatase: 60 U/L (ref 39–117)
Bilirubin, Direct: 0 mg/dL (ref 0.0–0.3)
Total Bilirubin: 0.3 mg/dL (ref 0.2–1.2)
Total Protein: 6.6 g/dL (ref 6.0–8.3)

## 2015-09-21 LAB — LIPID PANEL
CHOLESTEROL: 202 mg/dL — AB (ref 0–200)
HDL: 45.6 mg/dL (ref >39.00)
LDL Cholesterol: 129 mg/dL — ABNORMAL HIGH (ref 0–99)
NonHDL: 156.84
Total CHOL/HDL Ratio: 4
Triglycerides: 137 mg/dL (ref 0.0–149.0)
VLDL: 27.4 mg/dL (ref 0.0–40.0)

## 2015-09-21 LAB — BASIC METABOLIC PANEL
BUN: 16 mg/dL (ref 6–23)
CHLORIDE: 108 meq/L (ref 96–112)
CO2: 28 meq/L (ref 19–32)
Calcium: 8.9 mg/dL (ref 8.4–10.5)
Creatinine, Ser: 0.81 mg/dL (ref 0.40–1.20)
GFR: 76.62 mL/min (ref >60.00)
GLUCOSE: 92 mg/dL (ref 70–99)
POTASSIUM: 4.2 meq/L (ref 3.5–5.1)
SODIUM: 142 meq/L (ref 135–145)

## 2015-09-21 LAB — TSH: TSH: 3.68 u[IU]/mL (ref 0.35–4.50)

## 2015-09-29 ENCOUNTER — Encounter: Payer: Self-pay | Admitting: Family Medicine

## 2015-09-29 ENCOUNTER — Ambulatory Visit (INDEPENDENT_AMBULATORY_CARE_PROVIDER_SITE_OTHER): Payer: BC Managed Care – PPO | Admitting: Family Medicine

## 2015-09-29 VITALS — BP 110/80 | HR 106 | Temp 98.2°F | Ht 63.0 in | Wt 181.0 lb

## 2015-09-29 DIAGNOSIS — Z0000 Encounter for general adult medical examination without abnormal findings: Secondary | ICD-10-CM

## 2015-09-29 DIAGNOSIS — Z23 Encounter for immunization: Secondary | ICD-10-CM

## 2015-09-29 NOTE — Progress Notes (Signed)
Subjective:     Patient ID: Donna Shaw, female   DOB: [birth year], 60 y.o.   MRN: [id number]  HPI Patient seen for physical. She sees gynecologist yearly. Her father had colon cancer so she gets colonoscopy every 5 years. She had colonoscopy June 2016 which was basically normal. She has history of some varicose veins left leg greater than right and rarely has superficial thrombo-phlebitis. She is still getting paps per GYN. She is getting regular mammograms. Needs flu vaccine otherwise immunizations up-to-date.  She started walking and walks about one and 1/2 miles on treadmill most days of the week.  Past Medical History:  Diagnosis Date  . Arthritis    osteoarthritis neck  . Chicken pox    Past Surgical History:  Procedure Laterality Date  . MOUTH SURGERY    . TUBAL LIGATION      reports that she has never smoked. She has never used smokeless tobacco. Her alcohol and drug histories are not on file. family history includes Cancer in her father and mother; Diabetes in her brother. Allergies  Allergen Reactions  . Codeine     hives     Review of Systems  Constitutional: Negative for activity change, appetite change, fatigue, fever and unexpected weight change.  HENT: Negative for ear pain, hearing loss, sore throat and trouble swallowing.   Eyes: Negative for visual disturbance.  Respiratory: Negative for cough and shortness of breath.   Cardiovascular: Negative for chest pain and palpitations.  Gastrointestinal: Negative for abdominal pain, blood in stool, constipation and diarrhea.  Genitourinary: Negative for dysuria and hematuria.  Musculoskeletal: Negative for arthralgias, back pain and myalgias.  Skin: Negative for rash.  Neurological: Negative for dizziness, syncope and headaches.  Hematological: Negative for adenopathy.  Psychiatric/Behavioral: Negative for confusion and dysphoric mood.       Objective:   Physical Exam  Constitutional: She is oriented to  person, place, and time. She appears well-developed and well-nourished.  HENT:  Head: Normocephalic and atraumatic.  Eyes: EOM are normal. Pupils are equal, round, and reactive to light.  Neck: Normal range of motion. Neck supple. No thyromegaly present.  Cardiovascular: Normal rate, regular rhythm and normal heart sounds.   No murmur heard. Pulmonary/Chest: Breath sounds normal. No respiratory distress. She has no wheezes. She has no rales.  Abdominal: Soft. Bowel sounds are normal. She exhibits no distension and no mass. There is no tenderness. There is no rebound and no guarding.  Musculoskeletal: Normal range of motion. She exhibits no edema.  Lymphadenopathy:    She has no cervical adenopathy.  Neurological: She is alert and oriented to person, place, and time. She displays normal reflexes. No cranial nerve deficit.  Skin: No rash noted.  Psychiatric: She has a normal mood and affect. Her behavior is normal. Judgment and thought content normal.       Assessment:     Physical exam. Labs reviewed with no major concerns    Plan:     -Flu vaccine given -Continue regular exercise and try to lose some weight -Discussed hepatitis C screening. She has no specific risk factors. She wishes to wait til next year   Kristian CoveyBruce W Zaccheaus Storlie MD [facility]

## 2015-09-29 NOTE — Patient Instructions (Signed)
Varicose Veins Varicose veins are veins that have become enlarged and twisted. They are usually seen in the legs but can occur in other parts of the body as well. CAUSES This condition is the result of valves in the veins not working properly. Valves in the veins help to return blood from the leg to the heart. If these valves are damaged, blood flows backward and backs up into the veins in the leg near the skin. This causes the veins to become larger. RISK FACTORS People who are on their feet a lot, who are pregnant, or who are overweight are more likely to develop varicose veins. SIGNS AND SYMPTOMS  Bulging, twisted-appearing, bluish veins, most commonly found on the legs.  Leg pain or a feeling of heaviness. These symptoms may be worse at the end of the day.  Leg swelling.  Changes in skin color. DIAGNOSIS A health care provider can usually diagnose varicose veins by examining your legs. Your health care provider may also recommend an ultrasound of your leg veins. TREATMENT Most varicose veins can be treated at home.However, other treatments are available for people who have persistent symptoms or want to improve the cosmetic appearance of the varicose veins. These treatment options include:  Sclerotherapy. A solution is injected into the vein to close it off.  Laser treatment. A laser is used to heat the vein to close it off.  Radiofrequency vein ablation. An electrical current produced by radio waves is used to close off the vein.  Phlebectomy. The vein is surgically removed through small incisions made over the varicose vein.  Vein ligation and stripping. The vein is surgically removed through incisions made over the varicose vein after the vein has been tied (ligated). HOME CARE INSTRUCTIONS  Do not stand or sit in one position for long periods of time. Do not sit with your legs crossed. Rest with your legs raised during the day.  Wear compression stockings as directed by your  health care provider. These stockings help to prevent blood clots and reduce swelling in your legs.  Do not wear other tight, encircling garments around your legs, pelvis, or waist.  Walk as much as possible to increase blood flow.  Raise the foot of your bed at night with 2-inch blocks.  If you get a cut in the skin over the vein and the vein bleeds, lie down with your leg raised and press on it with a clean cloth until the bleeding stops. Then place a bandage (dressing) on the cut. See your health care provider if it continues to bleed. SEEK MEDICAL CARE IF:  The skin around your ankle starts to break down.  You have pain, redness, tenderness, or hard swelling in your leg over a vein.  You are uncomfortable because of leg pain.   This information is not intended to replace advice given to you by your health care provider. Make sure you discuss any questions you have with your health care provider.   Document Released: 10/05/2004 Document Revised: 01/16/2014 Document Reviewed: 05/13/2013 Elsevier Interactive Patient Education 2016 Elsevier Inc.  

## 2015-09-29 NOTE — Progress Notes (Signed)
Pre visit review using our clinic review tool, if applicable. No additional management support is needed unless otherwise documented below in the visit note. 

## 2016-11-13 ENCOUNTER — Ambulatory Visit: Payer: BC Managed Care – PPO

## 2016-11-14 ENCOUNTER — Ambulatory Visit: Payer: BC Managed Care – PPO | Admitting: Family Medicine

## 2016-11-14 VITALS — BP 124/80 | HR 75 | Temp 98.2°F | Ht 63.0 in | Wt 180.7 lb

## 2016-11-14 DIAGNOSIS — Z23 Encounter for immunization: Secondary | ICD-10-CM

## 2016-11-14 DIAGNOSIS — Z87440 Personal history of urinary (tract) infections: Secondary | ICD-10-CM

## 2016-11-14 DIAGNOSIS — H6122 Impacted cerumen, left ear: Secondary | ICD-10-CM

## 2016-11-14 DIAGNOSIS — R310 Gross hematuria: Secondary | ICD-10-CM

## 2016-11-14 LAB — POCT URINALYSIS DIPSTICK
BILIRUBIN UA: NEGATIVE
Glucose, UA: NEGATIVE
Ketones, UA: NEGATIVE
LEUKOCYTES UA: NEGATIVE
NITRITE UA: NEGATIVE
PH UA: 8 (ref 5.0–8.0)
PROTEIN UA: NEGATIVE
RBC UA: NEGATIVE
Spec Grav, UA: 1.01 (ref 1.010–1.025)
UROBILINOGEN UA: 0.2 U/dL

## 2016-11-14 NOTE — Patient Instructions (Signed)
Follow up for any recurrent blood in urine or recurrent burning with urination.

## 2016-11-14 NOTE — Progress Notes (Signed)
Subjective:     Patient ID: Donna Shaw, female   DOB: 1955-09-21, 61 y.o.   MRN: [id number]  HPI Patient seen for the following items  Urgent care follow-up. A week ago Sunday she developed some burning with urination and mild lower back pain along with episode of gross hematuria. No fever. She went to urgent care and states that her urine confirmed leukocytes and blood. She's not sure if culture was done. She was given what sounds like Rocephin intramuscular and prescribed Septra DS. Her symptoms promptly resolved. She's had no further hematuria since then. No history of kidney stones. No flank pain. No fever.  She has some fullness in the left ear past few days. No vertigo or dizziness. No nasal congestion. No ear pain or drainage.  Past Medical History:  Diagnosis Date  . Arthritis    osteoarthritis neck  . Chicken pox    Past Surgical History:  Procedure Laterality Date  . MOUTH SURGERY    . TUBAL LIGATION      reports that  has never smoked. she has never used smokeless tobacco. Her alcohol and drug histories are not on file. family history includes Cancer in her father and mother; Diabetes in her brother. Allergies  Allergen Reactions  . Codeine     hives  . Sulfamethoxazole-Tmp Ds [Sulfamethoxazole-Trimethoprim]     Pt is able to take medication however, she would prefer NOT to due to medication making her feel nauseated.     Review of Systems  Constitutional: Negative for chills and fever.  HENT: Negative for ear discharge, ear pain and hearing loss.   Gastrointestinal: Negative for abdominal pain.  Genitourinary: Negative for dysuria, flank pain, frequency and vaginal bleeding.  Neurological: Negative for dizziness and headaches.       Objective:   Physical Exam  Constitutional: She appears well-developed and well-nourished.  HENT:  Right ear canal is clear. Left is fully occluded with cerumen. We are able to remove this with curette without difficulty and  eardrum appears normal  Cardiovascular: Normal rate and regular rhythm.  Pulmonary/Chest: Effort normal and breath sounds normal. No respiratory distress. She has no wheezes. She has no rales.  Musculoskeletal: She exhibits no edema.       Assessment:     #1 recent episode of gross hematuria very likely secondary to UTI. Her symptoms promptly cleared after antibiotic treatment and urine dipstick today is completely normal with no blood or leukocytes  #2 ceruminosis left canal    Plan:     -Reassurance regarding normal urine. -Follow-up promptly for any recurrent hematuria or other concerns -Cerumen removed ear canal with curette without difficulty. Patient had symptomatic improvement immediately afterwards -Flu vaccine given  Kristian CoveyBruce W Burchette MD [facility]

## 2016-12-21 ENCOUNTER — Other Ambulatory Visit: Payer: Self-pay | Admitting: Obstetrics

## 2016-12-21 DIAGNOSIS — R928 Other abnormal and inconclusive findings on diagnostic imaging of breast: Secondary | ICD-10-CM

## 2016-12-25 ENCOUNTER — Other Ambulatory Visit: Payer: Self-pay | Admitting: Obstetrics

## 2016-12-25 ENCOUNTER — Ambulatory Visit
Admission: RE | Admit: 2016-12-25 | Discharge: 2016-12-25 | Disposition: A | Discharge status: Home / Self Care | Payer: BC Managed Care – PPO | Source: Ambulatory Visit | Attending: Obstetrics | Admitting: Obstetrics

## 2016-12-25 DIAGNOSIS — N6310 Unspecified lump in the right breast, unspecified quadrant: Secondary | ICD-10-CM

## 2016-12-25 DIAGNOSIS — R928 Other abnormal and inconclusive findings on diagnostic imaging of breast: Secondary | ICD-10-CM

## 2017-03-28 ENCOUNTER — Encounter: Payer: Self-pay | Admitting: Family Medicine

## 2017-03-28 ENCOUNTER — Ambulatory Visit: Payer: BC Managed Care – PPO | Admitting: Family Medicine

## 2017-03-28 VITALS — BP 102/70 | HR 100 | Temp 98.6°F | Wt 180.3 lb

## 2017-03-28 DIAGNOSIS — J209 Acute bronchitis, unspecified: Secondary | ICD-10-CM | POA: Diagnosis not present

## 2017-03-28 DIAGNOSIS — M7651 Patellar tendinitis, right knee: Secondary | ICD-10-CM | POA: Diagnosis not present

## 2017-03-28 MED ORDER — DICLOFENAC SODIUM 1 % TD GEL: 2.0000 g | Freq: Four times a day (QID) | TRANSDERMAL | 0 refills | Status: DC

## 2017-03-28 MED ORDER — DOXYCYCLINE HYCLATE 100 MG PO CAPS: 100.0000 mg | ORAL_CAPSULE | Freq: Two times a day (BID) | ORAL | 0 refills | Status: DC

## 2017-03-28 NOTE — Patient Instructions (Signed)

## 2017-03-28 NOTE — Progress Notes (Signed)
Subjective:     Patient ID: Evonnie PatSusan W Acre, female   DOB: 15-May-1955, 62 y.o.   MRN: [id number]  HPI Patient seen for the following issues  Onset last weekend of sore throat, right earache, cough. Cough productive of green sputum. She is concerned because she states that in the past bronchial things have usually taken several weeks, if not months, to resolve. Patient is a nonsmoker. No recent fever. No chills. Some increased malaise. No ear drainage. No dizziness.  Second acute and new complaint is right anterior knee pain. Intermittent symptoms for the past few weeks. Possibly slightly better now. She describes a "sharp "pain along her patellar tendon region. Does not have any consistent pain with walking or stairs. No prepatellar swelling or tenderness. No effusion. No alleviating factors. No clear exacerbating factors.  Past Medical History:  Diagnosis Date  . Arthritis    osteoarthritis neck  . Chicken pox    Past Surgical History:  Procedure Laterality Date  . MOUTH SURGERY    . TUBAL LIGATION      reports that  has never smoked. she has never used smokeless tobacco. Her alcohol and drug histories are not on file. family history includes Cancer in her father and mother; Diabetes in her brother. Allergies  Allergen Reactions  . Codeine     hives  . Sulfamethoxazole-Tmp Ds [Sulfamethoxazole-Trimethoprim]     Pt is able to take medication however, she would prefer NOT to due to medication making her feel nauseated.     Review of Systems  Constitutional: Positive for fatigue. Negative for chills and fever.  HENT: Positive for congestion, ear pain and sore throat. Negative for ear discharge, sinus pressure and sinus pain.   Respiratory: Positive for cough.   Neurological: Negative for weakness and headaches.  Hematological: Negative for adenopathy.       Objective:   Physical Exam  Constitutional: She appears well-developed and well-nourished.  HENT:  Right Ear: External  ear normal.  Left Ear: External ear normal.  Mild erythema posterior pharynx. No exudate  Neck: Neck supple.  Cardiovascular: Normal rate and regular rhythm.  Pulmonary/Chest: Effort normal and breath sounds normal. No respiratory distress. She has no wheezes. She has no rales.  Musculoskeletal:  Right knee full range of motion. No effusion. No erythema. No warmth. Mild tenderness along the lower pole of the patellar tendon. No prepatellar effusion.  Lymphadenopathy:    She has no cervical adenopathy.       Assessment:     #1 probable viral URI with cough-nonfocal exam  #2 right anterior knee pain. Suspect patellar tendinitis.    Plan:     -We recommended plenty of rest and fluids and observation regarding her URI. She is concerned because of past history of prolonged bronchitis. We did go ahead and give her doxycycline and to start if she has any fever or worsening symptoms -Recommend trial icing 20-30 minutes to to 3 times daily for right knee. -Also consider trial diclofenac 0.1% gel 2-3 times daily as needed. -Touch base if knee pain not resolving over the next few weeks  Kristian CoveyBruce W Moxon Messler MD [facility]

## 2017-04-05 ENCOUNTER — Telehealth: Payer: Self-pay | Admitting: *Deleted

## 2017-04-05 NOTE — Telephone Encounter (Signed)
Prior auth for Diclofenac Sodium 1% was sent to [email or web address].

## 2017-04-09 ENCOUNTER — Other Ambulatory Visit: Payer: Self-pay | Admitting: *Deleted

## 2017-04-09 MED ORDER — DICLOFENAC SODIUM 1 % TD GEL: 2.0000 g | Freq: Four times a day (QID) | TRANSDERMAL | 0 refills | Status: DC

## 2017-04-09 NOTE — Telephone Encounter (Signed)
Fax received from [facility] stating the request was denied and this was given to Dr Burchette's asst. 

## 2017-04-10 NOTE — Telephone Encounter (Signed)
Denial letter placed in Dr Lucie LeatherBurchette's folder

## 2017-04-10 NOTE — Telephone Encounter (Signed)
Is there anything we need to do to get this approved?

## 2017-04-11 NOTE — Telephone Encounter (Signed)
Let pt know her insurance denied the topical Diclofenac.  Focus on icing knee 2-3 times daily.  Touch base if not improving in 2-3 weeks.

## 2017-04-12 MED ORDER — BENZONATATE 200 MG PO CAPS: ORAL_CAPSULE | ORAL | 0 refills | Status: DC

## 2017-04-12 NOTE — Telephone Encounter (Signed)
Patient returning call.

## 2017-04-12 NOTE — Telephone Encounter (Signed)
I called the pt and informed her of the message below.  She is aware the Rx was sent to her pharmacy.

## 2017-04-12 NOTE — Addendum Note (Signed)
Addended by: Johnella MoloneyFUNDERBURK, JO A on: 04/12/2017 01:09 PM   Modules accepted: Orders

## 2017-04-12 NOTE — Telephone Encounter (Signed)
Please send in Tessalon Pearls 200 mg Q8H PRN. #20, 0 refills.   I would play it safe and not visit until she is free of cough

## 2017-04-12 NOTE — Telephone Encounter (Signed)
I spoke with pt and went over below advice, pt agreed to follow. Also patient complaining of nagging cough, still hasn't gone away, asking for advice or script. She has another question also- has family member that was recently diagnosed with lung cancer- is it safe to visit with person since patient still has this cough?

## 2017-04-12 NOTE — Telephone Encounter (Signed)
I left a message for the pt to return my call.  CRM created also. 

## 2017-07-02 ENCOUNTER — Other Ambulatory Visit: Payer: Self-pay | Admitting: Obstetrics

## 2017-07-02 ENCOUNTER — Ambulatory Visit
Admission: RE | Admit: 2017-07-02 | Discharge: 2017-07-02 | Disposition: A | Discharge status: Home / Self Care | Payer: BC Managed Care – PPO | Source: Ambulatory Visit | Attending: Obstetrics | Admitting: Obstetrics

## 2017-07-02 DIAGNOSIS — N6310 Unspecified lump in the right breast, unspecified quadrant: Secondary | ICD-10-CM

## 2017-09-17 ENCOUNTER — Encounter: Payer: BC Managed Care – PPO | Admitting: Family Medicine

## 2017-10-08 ENCOUNTER — Encounter: Payer: Self-pay | Admitting: Family Medicine

## 2017-10-08 ENCOUNTER — Other Ambulatory Visit: Payer: Self-pay

## 2017-10-08 ENCOUNTER — Ambulatory Visit: Payer: BC Managed Care – PPO | Admitting: Family Medicine

## 2017-10-08 VITALS — BP 128/76 | HR 78 | Temp 97.9°F | Ht 63.0 in | Wt 181.5 lb

## 2017-10-08 DIAGNOSIS — N3000 Acute cystitis without hematuria: Secondary | ICD-10-CM

## 2017-10-08 DIAGNOSIS — R350 Frequency of micturition: Secondary | ICD-10-CM

## 2017-10-08 LAB — POCT URINALYSIS DIPSTICK
BILIRUBIN UA: NEGATIVE
Glucose, UA: NEGATIVE
Ketones, UA: NEGATIVE
NITRITE UA: NEGATIVE
PH UA: 7.5 (ref 5.0–8.0)
PROTEIN UA: NEGATIVE
Spec Grav, UA: 1.01 (ref 1.010–1.025)
UROBILINOGEN UA: 0.2 U/dL

## 2017-10-08 MED ORDER — NITROFURANTOIN MONOHYD MACRO 100 MG PO CAPS: 100.0000 mg | ORAL_CAPSULE | Freq: Two times a day (BID) | ORAL | 0 refills | Status: DC

## 2017-10-08 NOTE — Progress Notes (Signed)
  Subjective:     Patient ID: Donna Shaw, female   DOB: 16-Jan-1955, 62 y.o.   MRN: [id number]  HPI Patient seen for acute visit with onset this morning of urine frequency and burning with urination.  No flank pain.  Denies any nausea or vomiting.  No fevers or chills.  No gross hematuria.  Previous intolerance with Septra.  Past Medical History:  Diagnosis Date  . Arthritis    osteoarthritis neck  . Chicken pox    Past Surgical History:  Procedure Laterality Date  . MOUTH SURGERY    . TUBAL LIGATION      reports that she has never smoked. She has never used smokeless tobacco. Her alcohol and drug histories are not on file. family history includes Cancer in her father and mother; Diabetes in her brother. Allergies  Allergen Reactions  . Codeine     hives  . Sulfamethoxazole-Tmp Ds [Sulfamethoxazole-Trimethoprim]     Pt is able to take medication however, she would prefer NOT to due to medication making her feel nauseated.     Review of Systems  Constitutional: Negative for chills and fever.  Gastrointestinal: Negative for abdominal pain, nausea and vomiting.  Genitourinary: Positive for dysuria and frequency. Negative for flank pain and hematuria.       Objective:   Physical Exam  Constitutional: She appears well-developed and well-nourished.  Cardiovascular: Normal rate and regular rhythm.  Pulmonary/Chest: Effort normal and breath sounds normal.       Assessment:     1 day history of dysuria.  Urine dipstick reveals positive blood and positive leukocytes.  Suspect uncomplicated cystitis    Plan:     -Urine culture sent -Drink plenty of fluids -Macrobid 1 twice daily for 5 days pending culture results  Kristian Covey MD [facility] at [facility]

## 2017-10-08 NOTE — Patient Instructions (Signed)

## 2017-10-10 LAB — URINE CULTURE
MICRO NUMBER:: 91171335
SPECIMEN QUALITY:: ADEQUATE

## 2017-10-30 ENCOUNTER — Ambulatory Visit (INDEPENDENT_AMBULATORY_CARE_PROVIDER_SITE_OTHER): Payer: BC Managed Care – PPO | Admitting: Family Medicine

## 2017-10-30 ENCOUNTER — Other Ambulatory Visit: Payer: Self-pay

## 2017-10-30 ENCOUNTER — Encounter: Payer: Self-pay | Admitting: Family Medicine

## 2017-10-30 VITALS — BP 112/76 | HR 84 | Temp 98.0°F | Ht 63.0 in | Wt 179.9 lb

## 2017-10-30 DIAGNOSIS — Z23 Encounter for immunization: Secondary | ICD-10-CM | POA: Diagnosis not present

## 2017-10-30 DIAGNOSIS — Z0000 Encounter for general adult medical examination without abnormal findings: Secondary | ICD-10-CM | POA: Diagnosis not present

## 2017-10-30 LAB — HEPATIC FUNCTION PANEL
ALK PHOS: 64 U/L (ref 39–117)
ALT: 14 U/L (ref 0–35)
AST: 16 U/L (ref 0–37)
Albumin: 4.2 g/dL (ref 3.5–5.2)
BILIRUBIN DIRECT: 0.1 mg/dL (ref 0.0–0.3)
Total Bilirubin: 0.4 mg/dL (ref 0.2–1.2)
Total Protein: 6.5 g/dL (ref 6.0–8.3)

## 2017-10-30 LAB — CBC WITH DIFFERENTIAL/PLATELET
BASOS ABS: 0.1 10*3/uL (ref 0.0–0.1)
Basophils Relative: 1.7 % (ref 0.0–3.0)
Eosinophils Absolute: 0.1 10*3/uL (ref 0.0–0.7)
Eosinophils Relative: 1.7 % (ref 0.0–5.0)
HCT: 39.7 % (ref 36.0–46.0)
HEMOGLOBIN: 13.5 g/dL (ref 12.0–15.0)
LYMPHS ABS: 1.4 10*3/uL (ref 0.7–4.0)
Lymphocytes Relative: 26.2 % (ref 12.0–46.0)
MCHC: 33.9 g/dL (ref 30.0–36.0)
MCV: 93 fl (ref 78.0–100.0)
MONOS PCT: 9.8 % (ref 3.0–12.0)
Monocytes Absolute: 0.5 10*3/uL (ref 0.1–1.0)
NEUTROS PCT: 60.6 % (ref 43.0–77.0)
Neutro Abs: 3.3 10*3/uL (ref 1.4–7.7)
Platelets: 389 10*3/uL (ref 150.0–400.0)
RBC: 4.27 Mil/uL (ref 3.87–5.11)
RDW: 12.8 % (ref 11.5–15.5)
WBC: 5.4 10*3/uL (ref 4.0–10.5)

## 2017-10-30 LAB — LIPID PANEL
Cholesterol: 194 mg/dL (ref 0–200)
HDL: 44.2 mg/dL (ref >39.00)
LDL Cholesterol: 124 mg/dL — ABNORMAL HIGH (ref 0–99)
NONHDL: 149.68
Total CHOL/HDL Ratio: 4
Triglycerides: 128 mg/dL (ref 0.0–149.0)
VLDL: 25.6 mg/dL (ref 0.0–40.0)

## 2017-10-30 LAB — BASIC METABOLIC PANEL
BUN: 20 mg/dL (ref 6–23)
CALCIUM: 9.4 mg/dL (ref 8.4–10.5)
CO2: 29 meq/L (ref 19–32)
Chloride: 107 mEq/L (ref 96–112)
Creatinine, Ser: 0.87 mg/dL (ref 0.40–1.20)
GFR: 70.07 mL/min (ref >60.00)
GLUCOSE: 94 mg/dL (ref 70–99)
Potassium: 4.3 mEq/L (ref 3.5–5.1)
SODIUM: 141 meq/L (ref 135–145)

## 2017-10-30 LAB — TSH: TSH: 3.3 u[IU]/mL (ref 0.35–4.50)

## 2017-10-30 NOTE — Patient Instructions (Signed)
Food Choices for Gastroesophageal Reflux Disease, Adult When you have gastroesophageal reflux disease (GERD), the foods you eat and your eating habits are very important. Choosing the right foods can help ease the discomfort of GERD. Consider working with a diet and nutrition specialist (dietitian) to help you make healthy food choices. What general guidelines should I follow? Eating plan  Choose healthy foods low in fat, such as fruits, vegetables, whole grains, low-fat dairy products, and lean meat, fish, and poultry.  Eat frequent, small meals instead of three large meals each day. Eat your meals slowly, in a relaxed setting. Avoid bending over or lying down until 2-3 hours after eating.  Limit high-fat foods such as fatty meats or fried foods.  Limit your intake of oils, butter, and shortening to less than 8 teaspoons each day.  Avoid the following: ? Foods that cause symptoms. These may be different for different people. Keep a food diary to keep track of foods that cause symptoms. ? Alcohol. ? Drinking large amounts of liquid with meals. ? Eating meals during the 2-3 hours before bed.  Cook foods using methods other than frying. This may include baking, grilling, or broiling. Lifestyle   Maintain a healthy weight. Ask your health care provider what weight is healthy for you. If you need to lose weight, work with your health care provider to do so safely.  Exercise for at least 30 minutes on 5 or more days each week, or as told by your health care provider.  Avoid wearing clothes that fit tightly around your waist and chest.  Do not use any products that contain nicotine or tobacco, such as cigarettes and e-cigarettes. If you need help quitting, ask your health care provider.  Sleep with the head of your bed raised. Use a wedge under the mattress or blocks under the bed frame to raise the head of the bed. What foods are not recommended? The items listed may not be a complete  list. Talk with your dietitian about what dietary choices are best for you. Grains Pastries or quick breads with added fat. French toast. Vegetables Deep fried vegetables. French fries. Any vegetables prepared with added fat. Any vegetables that cause symptoms. For some people this may include tomatoes and tomato products, chili peppers, onions and garlic, and horseradish. Fruits Any fruits prepared with added fat. Any fruits that cause symptoms. For some people this may include citrus fruits, such as oranges, grapefruit, pineapple, and lemons. Meats and other protein foods High-fat meats, such as fatty beef or pork, hot dogs, ribs, ham, sausage, salami and bacon. Fried meat or protein, including fried fish and fried chicken. Nuts and nut butters. Dairy Whole milk and chocolate milk. Sour cream. Cream. Ice cream. Cream cheese. Milk shakes. Beverages Coffee and tea, with or without caffeine. Carbonated beverages. Sodas. Energy drinks. Fruit juice made with acidic fruits (such as [place] or grapefruit). Tomato juice. Alcoholic drinks. Fats and oils Butter. Margarine. Shortening. Ghee. Sweets and desserts Chocolate and cocoa. Donuts. Seasoning and other foods Pepper. Peppermint and spearmint. Any condiments, herbs, or seasonings that cause symptoms. For some people, this may include curry, hot sauce, or vinegar-based salad dressings. Summary  When you have gastroesophageal reflux disease (GERD), food and lifestyle choices are very important to help ease the discomfort of GERD.  Eat frequent, small meals instead of three large meals each day. Eat your meals slowly, in a relaxed setting. Avoid bending over or lying down until 2-3 hours after eating.  Limit high-fat   foods such as fatty meat or fried foods. This information is not intended to replace advice given to you by your health care provider. Make sure you discuss any questions you have with your health care provider. Document Released:  12/26/2004 Document Revised: 12/28/2015 Document Reviewed: 12/28/2015 Elsevier Interactive Patient Education  2018 [employer].  Consider Pepcid 20 mg once or twice daily as needed  Consider newer shingles vaccine (Shingrix) and check on insurance coverage if interested.

## 2017-10-30 NOTE — Progress Notes (Signed)
Subjective:     Patient ID: Donna Shaw, female   DOB: Dec 24, 1955, 62 y.o.   MRN: [id number]  HPI Patient is seen for physical exam.  She sees gynecologist yearly.  She is postmenopausal and is on estrogen and progesterone replacement for hot flashes.  Her gynecologist has been tapering her dose of estrogen back gradually.  She still has hot flashes.  She has history of GERD and had been taking Zantac with good control.  She has recently been taking some Tums.  Is asking for replacement Health maintenance issues reviewed  -Colonoscopy 07/09/2015 normal -Tetanus 01/22/2011 -Scheduled mammogram for 01/15/2018 -Influenza vaccine today -No history of hepatitis C screening  Past Medical History:  Diagnosis Date  . Arthritis    osteoarthritis neck  . Chicken pox    Past Surgical History:  Procedure Laterality Date  . MOUTH SURGERY    . TUBAL LIGATION      reports that she has never smoked. She has never used smokeless tobacco. She reports that she drinks about 1.0 standard drinks of alcohol per week. She reports that she does not use drugs. family history includes Cancer in her father and mother; Diabetes in her brother. Allergies  Allergen Reactions  . Codeine     hives  . Sulfamethoxazole-Tmp Ds [Sulfamethoxazole-Trimethoprim]     Pt is able to take medication however, she would prefer NOT to due to medication making her feel nauseated.     Review of Systems  Constitutional: Negative for activity change, appetite change, fatigue, fever and unexpected weight change.  HENT: Negative for ear pain, hearing loss and sore throat.        Occasional trouble swallowing pills but no solid food dysphagia  Eyes: Negative for visual disturbance.  Respiratory: Negative for cough and shortness of breath.   Cardiovascular: Negative for chest pain and palpitations.  Gastrointestinal: Negative for abdominal pain, blood in stool, constipation and diarrhea.  Endocrine: Positive for heat  intolerance.  Genitourinary: Negative for dysuria and hematuria.  Musculoskeletal: Negative for arthralgias, back pain and myalgias.  Skin: Negative for rash.  Neurological: Negative for dizziness, syncope and headaches.  Hematological: Negative for adenopathy.  Psychiatric/Behavioral: Negative for confusion and dysphoric mood.       Objective:   Physical Exam  Constitutional: She is oriented to person, place, and time. She appears well-developed and well-nourished.  HENT:  Head: Normocephalic and atraumatic.  Right Ear: External ear normal.  Left Ear: External ear normal.  Mouth/Throat: Oropharynx is clear and moist.  Eyes: Pupils are equal, round, and reactive to light. EOM are normal.  Neck: Normal range of motion. Neck supple. No thyromegaly present.  Cardiovascular: Normal rate, regular rhythm and normal heart sounds.  No murmur heard. Pulmonary/Chest: Breath sounds normal. No respiratory distress. She has no wheezes. She has no rales.  Abdominal: Soft. Bowel sounds are normal. She exhibits no distension and no mass. There is no tenderness. There is no rebound and no guarding.  Musculoskeletal: Normal range of motion. She exhibits no edema.  Lymphadenopathy:    She has no cervical adenopathy.  Neurological: She is alert and oriented to person, place, and time. She displays normal reflexes. No cranial nerve deficit.  Skin: No rash noted.  Psychiatric: She has a normal mood and affect. Her behavior is normal. Judgment and thought content normal.       Assessment:     Physical exam.  The following issues were discussed    Plan:     -Consider  Pepcid 20 mg twice daily in place of her Zantac -Discussed Shingrix and she will check on insurance coverage -She will continue GYN follow-up for mammograms and Pap smears -Flu vaccine given -Check hepatitis C antibody -Check screening labs -She is encouraged to continue with weight loss efforts and regular exercise  Kristian Covey MD [facility] at [facility]

## 2017-10-30 NOTE — Addendum Note (Signed)
Addended by: Ronita Hipps on: 10/30/2017 09:13 AM   Modules accepted: Orders

## 2017-10-31 LAB — HEPATITIS C ANTIBODY
HEP C AB: NONREACTIVE
SIGNAL TO CUT-OFF: 0.01 (ref <1.00)

## 2018-01-15 ENCOUNTER — Ambulatory Visit
Admission: RE | Admit: 2018-01-15 | Discharge: 2018-01-15 | Disposition: A | Discharge status: Home / Self Care | Payer: BC Managed Care – PPO | Source: Ambulatory Visit | Attending: Obstetrics | Admitting: Obstetrics

## 2018-01-15 ENCOUNTER — Ambulatory Visit: Payer: BC Managed Care – PPO

## 2018-01-15 ENCOUNTER — Other Ambulatory Visit: Payer: BC Managed Care – PPO

## 2018-01-15 DIAGNOSIS — N6310 Unspecified lump in the right breast, unspecified quadrant: Secondary | ICD-10-CM

## 2018-07-02 ENCOUNTER — Ambulatory Visit (INDEPENDENT_AMBULATORY_CARE_PROVIDER_SITE_OTHER): Payer: BC Managed Care – PPO | Admitting: Family Medicine

## 2018-07-02 ENCOUNTER — Other Ambulatory Visit: Payer: Self-pay

## 2018-07-02 DIAGNOSIS — R300 Dysuria: Secondary | ICD-10-CM | POA: Diagnosis not present

## 2018-07-02 MED ORDER — NITROFURANTOIN MONOHYD MACRO 100 MG PO CAPS: 100.0000 mg | ORAL_CAPSULE | Freq: Two times a day (BID) | ORAL | 0 refills | Status: DC

## 2018-07-02 NOTE — Progress Notes (Signed)
Patient ID: PAGE PUCCIARELLI, female   DOB: [birth year], 63 y.o.   MRN: [id number]  This visit type was conducted due to national recommendations for restrictions regarding the COVID-19 pandemic in an effort to limit this patient's exposure and mitigate transmission in our community.   Virtual Visit via Video Note  I connected with Elda Dunkerson on 07/02/18 at  9:15 AM EDT by a video enabled telemedicine application and verified that I am speaking with the correct person using two identifiers.  Location patient: home Location provider:work or home office Persons participating in the virtual visit: patient, provider  I discussed the limitations of evaluation and management by telemedicine and the availability of in person appointments. The patient expressed understanding and agreed to proceed.   HPI:  She relates 1 day history of some suprapubic pressure, urine frequency, and some burning with urination.  No gross blood.  No fevers or chills.  She has had some mild left lower back pain.  No nausea or vomiting.  Very similar symptoms to what she had back in September.  She grew out E. coli which was treated with Macrobid which she tolerated well.  She has allergy to sulfa.  No recent hospitalization.  No recent instrumentation   ROS: See pertinent positives and negatives per HPI.  Past Medical History:  Diagnosis Date  . Arthritis    osteoarthritis neck  . Chicken pox     Past Surgical History:  Procedure Laterality Date  . MOUTH SURGERY    . TUBAL LIGATION      Family History  Problem Relation Age of Onset  . Cancer Mother        lung  . Cancer Father        colon  . Diabetes Brother     SOCIAL HX: Non-smoker   Current Outpatient Medications:  .  diclofenac sodium (VOLTAREN) 1 % GEL, Apply 2 g topically 4 (four) times daily. Please schedule a physical for more refills. thanks (Patient taking differently: Apply 2 g topically as needed. Please schedule a physical for more  refills. thanks), Disp: 100 g, Rfl: 0 .  ELESTRIN 0.52 MG/0.87 GM (0.06%) GEL, Apply 1 application topically daily. To upper arm per Dr Pamala Hurry, Disp: , Rfl:  .  nitrofurantoin, macrocrystal-monohydrate, (MACROBID) 100 MG capsule, Take 1 capsule (100 mg total) by mouth 2 (two) times daily., Disp: 10 capsule, Rfl: 0 .  progesterone (PROMETRIUM) 100 MG capsule, Take 100 mg by mouth daily. , Disp: , Rfl:  .  Ranitidine HCl (ZANTAC PO), Take by mouth., Disp: , Rfl:   EXAM:  VITALS per patient if applicable:  GENERAL: alert, oriented, appears well and in no acute distress  HEENT: atraumatic, conjunttiva clear, no obvious abnormalities on inspection of external nose and ears  NECK: normal movements of the head and neck  LUNGS: on inspection no signs of respiratory distress, breathing rate appears normal, no obvious gross SOB, gasping or wheezing  CV: no obvious cyanosis  MS: moves all visible extremities without noticeable abnormality  PSYCH/NEURO: pleasant and cooperative, no obvious depression or anxiety, speech and thought processing grossly intact  ASSESSMENT AND PLAN:  Discussed the following assessment and plan:  Probable uncomplicated cystitis  -Start Macrobid 1 twice daily for 5 days -We had recommended and offered that patient come in for urinalysis but she declines and prefers to try empiric antibiotic first.  Low risk for resistant bug -Plenty fluids and follow-up promptly for any fever or any worsening or persistent symptoms  I discussed the assessment and treatment plan with the patient. The patient was provided an opportunity to ask questions and all were answered. The patient agreed with the plan and demonstrated an understanding of the instructions.   The patient was advised to call back or seek an in-person evaluation if the symptoms worsen or if the condition fails to improve as anticipated.   Carolann Littler, MD

## 2018-10-10 ENCOUNTER — Ambulatory Visit (INDEPENDENT_AMBULATORY_CARE_PROVIDER_SITE_OTHER): Payer: BC Managed Care – PPO

## 2018-10-10 ENCOUNTER — Other Ambulatory Visit: Payer: Self-pay

## 2018-10-10 DIAGNOSIS — Z23 Encounter for immunization: Secondary | ICD-10-CM

## 2019-02-06 ENCOUNTER — Other Ambulatory Visit: Payer: Self-pay | Admitting: Obstetrics

## 2019-02-06 DIAGNOSIS — N6310 Unspecified lump in the right breast, unspecified quadrant: Secondary | ICD-10-CM

## 2019-02-26 ENCOUNTER — Ambulatory Visit
Admission: RE | Admit: 2019-02-26 | Discharge: 2019-02-26 | Disposition: A | Discharge status: Home / Self Care | Payer: BC Managed Care – PPO | Source: Ambulatory Visit | Attending: Obstetrics | Admitting: Obstetrics

## 2019-02-26 ENCOUNTER — Other Ambulatory Visit: Payer: Self-pay

## 2019-02-26 DIAGNOSIS — N6310 Unspecified lump in the right breast, unspecified quadrant: Secondary | ICD-10-CM

## 2019-03-24 ENCOUNTER — Other Ambulatory Visit: Payer: Self-pay

## 2019-03-24 ENCOUNTER — Encounter: Payer: Self-pay | Admitting: Family Medicine

## 2019-03-24 ENCOUNTER — Ambulatory Visit (INDEPENDENT_AMBULATORY_CARE_PROVIDER_SITE_OTHER): Payer: BC Managed Care – PPO | Admitting: Family Medicine

## 2019-03-24 VITALS — BP 136/80 | HR 66 | Temp 97.8°F | Wt 188.4 lb

## 2019-03-24 DIAGNOSIS — S39012A Strain of muscle, fascia and tendon of lower back, initial encounter: Secondary | ICD-10-CM

## 2019-03-24 MED ORDER — METHOCARBAMOL 500 MG PO TABS: 500.0000 mg | ORAL_TABLET | Freq: Three times a day (TID) | ORAL | 0 refills | Status: DC | PRN

## 2019-03-24 NOTE — Patient Instructions (Signed)
Try some heat or ice  May take up to 800 mg Motrin every 8 hours with food.

## 2019-03-24 NOTE — Progress Notes (Signed)
  Subjective:     Patient ID: Donna Shaw, female   DOB: 03-17-1955, 64 y.o.   MRN: [id number]  HPI Donna Shaw is seen for left lower lumbar back pain.  Denies specific injury but on Saturday she did a lot of yard work and thinks she may have aggravated things then.  She has pain especially when getting out of bed and changing positions.  She has tolerated walking and actually feels somewhat better when walking.  She had severe pain up to 9 out of 10 in severity this morning when trying to get out of bed.  No radiculitis symptoms.  Denies any urine or stool incontinence.  No weakness or numbness in lower extremity.  Stick some Advil but only took 200 mg with possibly some mild improvement.  This is a sharp pain which comes on very suddenly.  Denies prior history of back difficulties.  No dysuria.  No fevers or chills.  No appetite or weight changes.  Past Medical History:  Diagnosis Date  . Arthritis    osteoarthritis neck  . Chicken pox    Past Surgical History:  Procedure Laterality Date  . MOUTH SURGERY    . TUBAL LIGATION      reports that she has never smoked. She has never used smokeless tobacco. She reports current alcohol use of about 1.0 standard drinks of alcohol per week. She reports that she does not use drugs. family history includes Cancer in her father and mother; Diabetes in her brother. Allergies  Allergen Reactions  . Codeine     hives  . Sulfamethoxazole-Tmp Ds [Sulfamethoxazole-Trimethoprim]     Pt is able to take medication however, she would prefer NOT to due to medication making her feel nauseated.     Review of Systems  Constitutional: Negative for appetite change, chills, fever and unexpected weight change.  Genitourinary: Negative for dysuria.  Musculoskeletal: Positive for back pain.  Neurological: Negative for weakness and numbness.       Objective:   Physical Exam Vitals reviewed.  Constitutional:      Appearance: Normal appearance.   Cardiovascular:     Rate and Rhythm: Normal rate and regular rhythm.  Pulmonary:     Effort: Pulmonary effort is normal.     Breath sounds: Normal breath sounds.  Musculoskeletal:     Comments: She has some tenderness in the left lower lumbar area.  No spinal tenderness.  She was unable to sit down to examine for straight leg raises  Neurological:     General: No focal deficit present.     Mental Status: She is alert.     Comments: No focal strength deficits.  Patient able to walk on her heels and toes without difficulty        Assessment:     Acute low back pain left lower lumbar region.  Suspect musculoskeletal    Plan:     -Recommend she try heat or ice for symptom relief -Continue walking as tolerated -Continue with Advil 400 to 600 mg every 8 hours as needed -Robaxin 500 mg 1 every 8 hours as needed for muscle spasm -Touch base if symptoms not improving over the next couple of weeks  Donna Covey MD  Primary Care at [facility]

## 2019-05-16 ENCOUNTER — Telehealth (INDEPENDENT_AMBULATORY_CARE_PROVIDER_SITE_OTHER): Payer: BC Managed Care – PPO | Admitting: Family Medicine

## 2019-05-16 DIAGNOSIS — R300 Dysuria: Secondary | ICD-10-CM

## 2019-05-16 DIAGNOSIS — R059 Cough, unspecified: Secondary | ICD-10-CM

## 2019-05-16 DIAGNOSIS — R05 Cough: Secondary | ICD-10-CM | POA: Diagnosis not present

## 2019-05-16 MED ORDER — NITROFURANTOIN MONOHYD MACRO 100 MG PO CAPS: 100.0000 mg | ORAL_CAPSULE | Freq: Two times a day (BID) | ORAL | 0 refills | Status: DC

## 2019-05-16 NOTE — Progress Notes (Signed)
Patient ID: Donna Shaw, female   DOB: 03-03-1955, 64 y.o.   MRN: [id number]  This visit type was conducted due to national recommendations for restrictions regarding the COVID-19 pandemic in an effort to limit this patient's exposure and mitigate transmission in our community.   Virtual Visit via Video Note  I connected with Donna Shaw on 05/16/19 at 10:45 AM EDT by a video enabled telemedicine application and verified that I am speaking with the correct person using two identifiers.  Location patient: home Location provider:work or home office Persons participating in the virtual visit: patient, provider  I discussed the limitations of evaluation and management by telemedicine and the availability of in person appointments. The patient expressed understanding and agreed to proceed.   HPI: Donna Shaw relates some increased urine frequency and burning for the past day.  She had similar symptoms back in the summer was treated with Macrobid and symptoms resolved.  Back in September 2019 she had E. coli UTI.  No flank pain.  No nausea or vomiting.  No fever.  Allergy to sulfa.  Her other issue is 1 week history of dry cough.  No fever.  No dyspnea.  She thinks this may be allergy related.  No major nasal congestive symptoms.  She has had Covid vaccine weeks ago.  No sick contacts.   ROS: See pertinent positives and negatives per HPI.  Past Medical History:  Diagnosis Date  . Arthritis    osteoarthritis neck  . Chicken pox     Past Surgical History:  Procedure Laterality Date  . MOUTH SURGERY    . TUBAL LIGATION      Family History  Problem Relation Age of Onset  . Cancer Mother        lung  . Cancer Father        colon  . Diabetes Brother     SOCIAL HX: Non-smoker   Current Outpatient Medications:  .  diclofenac sodium (VOLTAREN) 1 % GEL, Apply 2 g topically 4 (four) times daily. Please schedule a physical for more refills. thanks (Patient not taking: Reported on  03/24/2019), Disp: 100 g, Rfl: 0 .  ELESTRIN 0.52 MG/0.87 GM (0.06%) GEL, Apply 1 application topically daily. To upper arm per Dr Pamala Hurry, Disp: , Rfl:  .  methocarbamol (ROBAXIN) 500 MG tablet, Take 1 tablet (500 mg total) by mouth every 8 (eight) hours as needed for muscle spasms., Disp: 30 tablet, Rfl: 0 .  nitrofurantoin, macrocrystal-monohydrate, (MACROBID) 100 MG capsule, Take 1 capsule (100 mg total) by mouth 2 (two) times daily., Disp: 10 capsule, Rfl: 0 .  progesterone (PROMETRIUM) 100 MG capsule, Take 100 mg by mouth daily. , Disp: , Rfl:  .  Ranitidine HCl (ZANTAC PO), Take by mouth., Disp: , Rfl:   EXAM:  VITALS per patient if applicable:  GENERAL: alert, oriented, appears well and in no acute distress  HEENT: atraumatic, conjunttiva clear, no obvious abnormalities on inspection of external nose and ears  NECK: normal movements of the head and neck  LUNGS: on inspection no signs of respiratory distress, breathing rate appears normal, no obvious gross SOB, gasping or wheezing  CV: no obvious cyanosis  MS: moves all visible extremities without noticeable abnormality  PSYCH/NEURO: pleasant and cooperative, no obvious depression or anxiety, speech and thought processing grossly intact  ASSESSMENT AND PLAN:  Discussed the following assessment and plan:  #1 dysuria.  Suspect uncomplicated cystitis based on history  -Send in [place] 1 twice daily for 5 days -[employer]  of fluids -Follow-up promptly for any fever or any worsening or persistent symptoms  #2 dry cough.  She does not have any worrisome symptoms such as fever or dyspnea.  -Observe for now.  Follow-up for any persistent or worsening cough     I discussed the assessment and treatment plan with the patient. The patient was provided an opportunity to ask questions and all were answered. The patient agreed with the plan and demonstrated an understanding of the instructions.   The patient was advised to call back  or seek an in-person evaluation if the symptoms worsen or if the condition fails to improve as anticipated.     Evelena Peat, MD

## 2019-08-12 ENCOUNTER — Other Ambulatory Visit: Payer: Self-pay

## 2019-08-12 ENCOUNTER — Ambulatory Visit (INDEPENDENT_AMBULATORY_CARE_PROVIDER_SITE_OTHER): Payer: BC Managed Care – PPO | Admitting: Family Medicine

## 2019-08-12 ENCOUNTER — Encounter: Payer: Self-pay | Admitting: Family Medicine

## 2019-08-12 VITALS — BP 124/76 | HR 64 | Temp 97.8°F | Ht 63.0 in | Wt 183.2 lb

## 2019-08-12 DIAGNOSIS — Z0000 Encounter for general adult medical examination without abnormal findings: Secondary | ICD-10-CM | POA: Diagnosis not present

## 2019-08-12 LAB — BASIC METABOLIC PANEL
BUN: 17 mg/dL (ref 7–25)
CO2: 26 mmol/L (ref 20–32)
Calcium: 9.8 mg/dL (ref 8.6–10.4)
Chloride: 107 mmol/L (ref 98–110)
Creat: 0.91 mg/dL (ref 0.50–0.99)
Glucose, Bld: 90 mg/dL (ref 65–99)
Potassium: 4.3 mmol/L (ref 3.5–5.3)
Sodium: 141 mmol/L (ref 135–146)

## 2019-08-12 LAB — CBC WITH DIFFERENTIAL/PLATELET
Absolute Monocytes: 536 cells/uL (ref 200–950)
Basophils Absolute: 99 cells/uL (ref 0–200)
Basophils Relative: 1.9 %
Eosinophils Absolute: 109 cells/uL (ref 15–500)
Eosinophils Relative: 2.1 %
HCT: 40.6 % (ref 35.0–45.0)
Hemoglobin: 13.5 g/dL (ref 11.7–15.5)
Lymphs Abs: 1503 cells/uL (ref 850–3900)
MCH: 31.3 pg (ref 27.0–33.0)
MCHC: 33.3 g/dL (ref 32.0–36.0)
MCV: 94 fL (ref 80.0–100.0)
MPV: 9.4 fL (ref 7.5–12.5)
Monocytes Relative: 10.3 %
Neutro Abs: 2954 cells/uL (ref 1500–7800)
Neutrophils Relative %: 56.8 %
Platelets: 407 10*3/uL — ABNORMAL HIGH (ref 140–400)
RBC: 4.32 10*6/uL (ref 3.80–5.10)
RDW: 11.9 % (ref 11.0–15.0)
Total Lymphocyte: 28.9 %
WBC: 5.2 10*3/uL (ref 3.8–10.8)

## 2019-08-12 LAB — LIPID PANEL
Cholesterol: 207 mg/dL — ABNORMAL HIGH (ref <200)
HDL: 49 mg/dL — ABNORMAL LOW (ref >=50)
LDL Cholesterol (Calc): 134 mg/dL (calc) — ABNORMAL HIGH
Non-HDL Cholesterol (Calc): 158 mg/dL (calc) — ABNORMAL HIGH (ref <130)
Total CHOL/HDL Ratio: 4.2 (calc) (ref <5.0)
Triglycerides: 129 mg/dL (ref <150)

## 2019-08-12 LAB — HEPATIC FUNCTION PANEL
AG Ratio: 1.6 (calc) (ref 1.0–2.5)
ALT: 12 U/L (ref 6–29)
AST: 18 U/L (ref 10–35)
Albumin: 4.1 g/dL (ref 3.6–5.1)
Alkaline phosphatase (APISO): 70 U/L (ref 37–153)
Bilirubin, Direct: 0.1 mg/dL (ref 0.0–0.2)
Globulin: 2.5 g/dL (calc) (ref 1.9–3.7)
Indirect Bilirubin: 0.3 mg/dL (calc) (ref 0.2–1.2)
Total Bilirubin: 0.4 mg/dL (ref 0.2–1.2)
Total Protein: 6.6 g/dL (ref 6.1–8.1)

## 2019-08-12 LAB — TSH: TSH: 2.82 mIU/L (ref 0.40–4.50)

## 2019-08-12 MED ORDER — METHOCARBAMOL 500 MG PO TABS: 500.0000 mg | ORAL_TABLET | Freq: Three times a day (TID) | ORAL | 0 refills | Status: DC | PRN

## 2019-08-12 NOTE — Progress Notes (Signed)
Established Patient Office Visit  Subjective:  Patient ID: Donna Shaw, female    DOB: Dec 22, 1955  Age: 64 y.o. MRN: [id number]  CC:  Chief Complaint  Patient presents with  . Annual Exam    pt has no new concerns     HPI Donna Shaw presents for physical exam.  She sees gynecologist yearly.  Generally fairly healthy.  She has on hormone replacement per GYN.  She sees them yearly.  Health maintenance reviewed  -Covid vaccines already given -Previous hepatitis C screen negative -Last colonoscopy was 2016 and she is due this year because of positive family history of intestinal cancer in her father -She had recent Pap smear just last month which was normal -Last mammogram 02/26/2019 -Tetanus due 2023 -Previous Zostavax 2015.  No history of Shingrix.  Past Medical History:  Diagnosis Date  . Arthritis    osteoarthritis neck  . Chicken pox     Past Surgical History:  Procedure Laterality Date  . MOUTH SURGERY    . TUBAL LIGATION      Family History  Problem Relation Age of Onset  . Cancer Mother        lung  . Cancer Father        colon  . Diabetes Brother     Social History   Socioeconomic History  . Marital status: Married    Spouse name: Not on file  . Number of children: Not on file  . Years of education: Not on file  . Highest education level: Not on file  Occupational History  . Not on file  Tobacco Use  . Smoking status: Never Smoker  . Smokeless tobacco: Never Used  Vaping Use  . Vaping Use: Never used  Substance and Sexual Activity  . Alcohol use: Yes    Alcohol/week: 1.0 standard drink    Types: 1 Glasses of wine per week    Comment: Occasionally  . Drug use: Never  . Sexual activity: Not on file  Other Topics Concern  . Not on file  Social History Narrative  . Not on file   Social Determinants of Health   Financial Resource Strain:   . Difficulty of Paying Living Expenses:   Food Insecurity:   . Worried About Brewing technologist in the Last Year:   . Barista in the Last Year:   Transportation Needs:   . Freight forwarder (Medical):   Marland Kitchen Lack of Transportation (Non-Medical):   Physical Activity:   . Days of Exercise per Week:   . Minutes of Exercise per Session:   Stress:   . Feeling of Stress :   Social Connections:   . Frequency of Communication with Friends and Family:   . Frequency of Social Gatherings with Friends and Family:   . Attends Religious Services:   . Active Member of Clubs or Organizations:   . Attends Banker Meetings:   Marland Kitchen Marital Status:   Intimate Partner Violence:   . Fear of Current or Ex-Partner:   . Emotionally Abused:   Marland Kitchen Physically Abused:   . Sexually Abused:     Outpatient Medications Prior to Visit  Medication Sig Dispense Refill  . ELESTRIN 0.52 MG/0.87 GM (0.06%) GEL Apply 1 application topically daily. To upper arm per Dr Ernestina Penna    . progesterone (PROMETRIUM) 100 MG capsule Take 100 mg by mouth daily.     . Ranitidine HCl (ZANTAC PO) Take by mouth.    Marland Kitchen  methocarbamol (ROBAXIN) 500 MG tablet Take 1 tablet (500 mg total) by mouth every 8 (eight) hours as needed for muscle spasms. 30 tablet 0  . nitrofurantoin, macrocrystal-monohydrate, (MACROBID) 100 MG capsule Take 1 capsule (100 mg total) by mouth 2 (two) times daily. 10 capsule 0  . diclofenac sodium (VOLTAREN) 1 % GEL Apply 2 g topically 4 (four) times daily. Please schedule a physical for more refills. thanks (Patient not taking: Reported on 03/24/2019) 100 g 0   No facility-administered medications prior to visit.    Allergies  Allergen Reactions  . Codeine     hives  . Sulfamethoxazole-Tmp Ds [Sulfamethoxazole-Trimethoprim]     Pt is able to take medication however, she would prefer NOT to due to medication making her feel nauseated.    ROS Review of Systems  Constitutional: Negative for activity change, appetite change, fatigue, fever and unexpected weight change.  HENT:  Negative for ear pain, hearing loss, sore throat and trouble swallowing.   Eyes: Negative for visual disturbance.  Respiratory: Negative for cough and shortness of breath.   Cardiovascular: Negative for chest pain and palpitations.  Gastrointestinal: Negative for abdominal pain, blood in stool, constipation and diarrhea.  Genitourinary: Negative for dysuria and hematuria.  Musculoskeletal: Negative for arthralgias, back pain and myalgias.  Skin: Negative for rash.  Neurological: Negative for dizziness, syncope and headaches.  Hematological: Negative for adenopathy.  Psychiatric/Behavioral: Negative for confusion and dysphoric mood.      Objective:    Physical Exam Constitutional:      Appearance: She is well-developed.  HENT:     Head: Normocephalic and atraumatic.  Eyes:     Pupils: Pupils are equal, round, and reactive to light.  Neck:     Thyroid: No thyromegaly.  Cardiovascular:     Rate and Rhythm: Normal rate and regular rhythm.     Heart sounds: Normal heart sounds. No murmur heard.   Pulmonary:     Effort: No respiratory distress.     Breath sounds: Normal breath sounds. No wheezing or rales.  Abdominal:     General: Bowel sounds are normal. There is no distension.     Palpations: Abdomen is soft. There is no mass.     Tenderness: There is no abdominal tenderness. There is no guarding or rebound.  Genitourinary:    Comments: Per GYN Musculoskeletal:        General: Normal range of motion.     Cervical back: Normal range of motion and neck supple.  Lymphadenopathy:     Cervical: No cervical adenopathy.  Skin:    Findings: No rash.  Neurological:     Mental Status: She is alert and oriented to person, place, and time.     Cranial Nerves: No cranial nerve deficit.     Deep Tendon Reflexes: Reflexes normal.  Psychiatric:        Behavior: Behavior normal.        Thought Content: Thought content normal.        Judgment: Judgment normal.     BP 124/76 (BP  Location: Left Arm, Patient Position: Sitting, Cuff Size: Normal)   Pulse 64   Temp 97.8 F (36.6 C) (Oral)   Ht 5\' 3"  (1.6 m)   Wt 183 lb 3.2 oz (83.1 kg)   LMP 06/16/2010   SpO2 96%   BMI 32.45 kg/m  Wt Readings from Last 3 Encounters:  08/12/19 183 lb 3.2 oz (83.1 kg)  03/24/19 188 lb 6.4 oz (85.5 kg)  10/30/17  179 lb 14.4 oz (81.6 kg)     Health Maintenance Due  Topic Date Due  . HIV Screening  Never done  . INFLUENZA VACCINE  08/10/2019    There are no preventive care reminders to display for this patient.  Lab Results  Component Value Date   TSH 3.30 10/30/2017   Lab Results  Component Value Date   WBC 5.4 10/30/2017   HGB 13.5 10/30/2017   HCT 39.7 10/30/2017   MCV 93.0 10/30/2017   PLT 389.0 10/30/2017   Lab Results  Component Value Date   NA 141 10/30/2017   K 4.3 10/30/2017   CO2 29 10/30/2017   GLUCOSE 94 10/30/2017   BUN 20 10/30/2017   CREATININE 0.87 10/30/2017   BILITOT 0.4 10/30/2017   ALKPHOS 64 10/30/2017   AST 16 10/30/2017   ALT 14 10/30/2017   PROT 6.5 10/30/2017   ALBUMIN 4.2 10/30/2017   CALCIUM 9.4 10/30/2017   GFR 70.07 10/30/2017   Lab Results  Component Value Date   CHOL 194 10/30/2017   Lab Results  Component Value Date   HDL 44.20 10/30/2017   Lab Results  Component Value Date   LDLCALC 124 (H) 10/30/2017   Lab Results  Component Value Date   TRIG 128.0 10/30/2017   Lab Results  Component Value Date   CHOLHDL 4 10/30/2017   No results found for: HGBA1C    Assessment & Plan:   Problem List Items Addressed This Visit    None    Visit Diagnoses    Physical exam    -  Primary   Relevant Orders   Basic metabolic panel   Lipid panel   CBC with Differential/Platelet   TSH   Hepatic function panel    We discussed several health maintenance issues as below  -Consider Shingrix vaccine and she will check on insurance coverage -Continue with annual flu vaccine -Covid vaccine already given -Schedule repeat  colonoscopy this year which will be 5 years from her past and she is getting every 5 years because of her family history -She will continue with mammograms and Pap smears through GYN -Recommend DEXA scan by age 61 -Tetanus booster in 2 years  Meds ordered this encounter  Medications  . methocarbamol (ROBAXIN) 500 MG tablet    Sig: Take 1 tablet (500 mg total) by mouth every 8 (eight) hours as needed for muscle spasms.    Dispense:  60 tablet    Refill:  0    Follow-up: No follow-ups on file.    Evelena Peat, MD

## 2019-08-12 NOTE — Patient Instructions (Signed)

## 2019-08-15 ENCOUNTER — Telehealth: Payer: Self-pay | Admitting: Family Medicine

## 2019-08-15 NOTE — Telephone Encounter (Signed)
Pts is calling in to schedule a [place] for the shingle vaccine is the pt eligible for the vaccine?  Pt and spouse would like to be scheduled on the day.

## 2019-08-15 NOTE — Telephone Encounter (Signed)
Pt was called and scheduled !

## 2019-08-15 NOTE — Telephone Encounter (Signed)
Okay to schedule

## 2019-08-15 NOTE — Telephone Encounter (Signed)
Please advise if okay to get  

## 2019-08-19 ENCOUNTER — Ambulatory Visit: Payer: BC Managed Care – PPO

## 2019-08-19 ENCOUNTER — Other Ambulatory Visit: Payer: Self-pay

## 2019-08-19 ENCOUNTER — Ambulatory Visit (INDEPENDENT_AMBULATORY_CARE_PROVIDER_SITE_OTHER): Payer: BC Managed Care – PPO

## 2019-08-19 DIAGNOSIS — Z23 Encounter for immunization: Secondary | ICD-10-CM

## 2019-08-19 NOTE — Progress Notes (Signed)
Per orders of Kristian Covey, MD, injection of 1st Shingrix given by Sherrin Daisy. Patient tolerated injection well

## 2019-11-24 ENCOUNTER — Ambulatory Visit (INDEPENDENT_AMBULATORY_CARE_PROVIDER_SITE_OTHER): Payer: BC Managed Care – PPO | Admitting: *Deleted

## 2019-11-24 ENCOUNTER — Other Ambulatory Visit: Payer: Self-pay

## 2019-11-24 DIAGNOSIS — Z23 Encounter for immunization: Secondary | ICD-10-CM | POA: Diagnosis not present

## 2019-11-24 NOTE — Progress Notes (Signed)
Patient here for Shingrix immunization. Vaccine administered with no immediate reaction.

## 2020-01-19 ENCOUNTER — Other Ambulatory Visit: Payer: Self-pay | Admitting: Obstetrics

## 2020-01-19 DIAGNOSIS — Z0000 Encounter for general adult medical examination without abnormal findings: Secondary | ICD-10-CM

## 2020-03-01 ENCOUNTER — Ambulatory Visit
Admission: RE | Admit: 2020-03-01 | Discharge: 2020-03-01 | Disposition: A | Discharge status: Home / Self Care | Payer: BC Managed Care – PPO | Source: Ambulatory Visit | Attending: Obstetrics | Admitting: Obstetrics

## 2020-03-01 ENCOUNTER — Other Ambulatory Visit: Payer: Self-pay

## 2020-03-01 DIAGNOSIS — Z0000 Encounter for general adult medical examination without abnormal findings: Secondary | ICD-10-CM

## 2020-03-08 ENCOUNTER — Other Ambulatory Visit: Payer: Self-pay | Admitting: Obstetrics

## 2020-03-08 DIAGNOSIS — R928 Other abnormal and inconclusive findings on diagnostic imaging of breast: Secondary | ICD-10-CM

## 2020-03-22 ENCOUNTER — Other Ambulatory Visit: Payer: Self-pay

## 2020-03-22 ENCOUNTER — Ambulatory Visit
Admission: RE | Admit: 2020-03-22 | Discharge: 2020-03-22 | Disposition: A | Discharge status: Home / Self Care | Payer: BC Managed Care – PPO | Source: Ambulatory Visit | Attending: Obstetrics | Admitting: Obstetrics

## 2020-03-22 ENCOUNTER — Other Ambulatory Visit: Payer: Self-pay | Admitting: Obstetrics

## 2020-03-22 DIAGNOSIS — R921 Mammographic calcification found on diagnostic imaging of breast: Secondary | ICD-10-CM

## 2020-03-22 DIAGNOSIS — R928 Other abnormal and inconclusive findings on diagnostic imaging of breast: Secondary | ICD-10-CM

## 2020-06-23 ENCOUNTER — Ambulatory Visit: Payer: BC Managed Care – PPO | Admitting: Family Medicine

## 2020-06-23 ENCOUNTER — Other Ambulatory Visit: Payer: Self-pay

## 2020-06-23 VITALS — BP 132/64 | HR 88 | Temp 98.7°F | Wt 186.8 lb

## 2020-06-23 DIAGNOSIS — M25561 Pain in right knee: Secondary | ICD-10-CM

## 2020-06-23 NOTE — Progress Notes (Signed)
Established Patient Office Visit  Subjective:  Patient ID: Donna Shaw, female    DOB: 02-20-1955  Age: 65 y.o. MRN: [id number]  CC:  Chief Complaint  Patient presents with   Knee Pain    X 2 weeks, L knee, pain started after walking on the treadmill,    HPI [name] presents for right knee pain.  Onset about 2 weeks ago.  She was walking on her treadmill and felt sudden pain.  This was after going faster on the treadmill and changing incline.  She has had a sensation of "buckling "occasionally since then.  She has still been doing some treadmill walking including half mile yesterday but is scaled back her distance and speed.  Denies any prior knee issues.  No locking of the knee.  Pain is more medial.  Past Medical History:  Diagnosis Date   Arthritis    osteoarthritis neck   Chicken pox     Past Surgical History:  Procedure Laterality Date   MOUTH SURGERY     TUBAL LIGATION      Family History  Problem Relation Age of Onset   Cancer Mother        lung   Cancer Father        colon   Diabetes Brother     Social History   Socioeconomic History   Marital status: Married    Spouse name: Not on file   Number of children: Not on file   Years of education: Not on file   Highest education level: Not on file  Occupational History   Not on file  Tobacco Use   Smoking status: Never   Smokeless tobacco: Never  Vaping Use   Vaping Use: Never used  Substance and Sexual Activity   Alcohol use: Yes    Alcohol/week: 1.0 standard drink    Types: 1 Glasses of wine per week    Comment: Occasionally   Drug use: Never   Sexual activity: Not on file  Other Topics Concern   Not on file  Social History Narrative   Not on file   Social Determinants of Health   Financial Resource Strain: Not on file  Food Insecurity: Not on file  Transportation Needs: Not on file  Physical Activity: Not on file  Stress: Not on file  Social Connections: Not on file  Intimate  Partner Violence: Not on file    Outpatient Medications Prior to Visit  Medication Sig Dispense Refill   diclofenac sodium (VOLTAREN) 1 % GEL Apply 2 g topically 4 (four) times daily. Please schedule a physical for more refills. thanks 100 g 0   ELESTRIN 0.52 MG/0.87 GM (0.06%) GEL Apply 1 application topically daily. To upper arm per Dr Ernestina Penna     methocarbamol (ROBAXIN) 500 MG tablet Take 1 tablet (500 mg total) by mouth every 8 (eight) hours as needed for muscle spasms. 60 tablet 0   progesterone (PROMETRIUM) 100 MG capsule Take 100 mg by mouth daily.      Ranitidine HCl (ZANTAC PO) Take by mouth.     No facility-administered medications prior to visit.    Allergies  Allergen Reactions   Codeine     hives   Sulfamethoxazole-Tmp Ds [Sulfamethoxazole-Trimethoprim]     Pt is able to take medication however, she would prefer NOT to due to medication making her feel nauseated.    ROS Review of Systems  Constitutional:  Negative for chills and fever.  Musculoskeletal:  Negative for  gait problem.  Neurological:  Negative for weakness.     Objective:    Physical Exam Vitals reviewed.  Constitutional:      Appearance: Normal appearance.  Cardiovascular:     Rate and Rhythm: Normal rate and regular rhythm.  Pulmonary:     Effort: Pulmonary effort is normal.     Breath sounds: Normal breath sounds.  Musculoskeletal:     Comments: Right knee- full ROM.  No warmth.   No erythema.   Mild medial joint tenderness.  Ligament testing normal.   No obvious effusion   Neurological:     Mental Status: She is alert.    BP 132/64 (BP Location: Left Arm, Patient Position: Sitting, Cuff Size: Normal)   Pulse 88   Temp 98.7 F (37.1 C) (Oral)   Wt 186 lb 12.8 oz (84.7 kg)   LMP 06/16/2010   SpO2 96%   BMI 33.09 kg/m  Wt Readings from Last 3 Encounters:  06/23/20 186 lb 12.8 oz (84.7 kg)  08/12/19 183 lb 3.2 oz (83.1 kg)  03/24/19 188 lb 6.4 oz (85.5 kg)     Health Maintenance  Due  Topic Date Due   HIV Screening  Never done   COVID-19 Vaccine (3 - Booster for Pfizer series) 09/17/2019    There are no preventive care reminders to display for this patient.  Lab Results  Component Value Date   TSH 2.82 08/12/2019   Lab Results  Component Value Date   WBC 5.2 08/12/2019   HGB 13.5 08/12/2019   HCT 40.6 08/12/2019   MCV 94.0 08/12/2019   PLT 407 (H) 08/12/2019   Lab Results  Component Value Date   NA 141 08/12/2019   K 4.3 08/12/2019   CO2 26 08/12/2019   GLUCOSE 90 08/12/2019   BUN 17 08/12/2019   CREATININE 0.91 08/12/2019   BILITOT 0.4 08/12/2019   ALKPHOS 64 10/30/2017   AST 18 08/12/2019   ALT 12 08/12/2019   PROT 6.6 08/12/2019   ALBUMIN 4.2 10/30/2017   CALCIUM 9.8 08/12/2019   GFR 70.07 10/30/2017   Lab Results  Component Value Date   CHOL 207 (H) 08/12/2019   Lab Results  Component Value Date   HDL 49 (L) 08/12/2019   Lab Results  Component Value Date   LDLCALC 134 (H) 08/12/2019   Lab Results  Component Value Date   TRIG 129 08/12/2019   Lab Results  Component Value Date   CHOLHDL 4.2 08/12/2019   No results found for: HGBA1C    Assessment & Plan:   2-week history of right knee pain.  Question medial meniscal tear Patient has no effusion at this time.  She does have some medial joint line tenderness.  -Recommend icing 15 to 20 minutes 2-3 times daily -Consider trial of over-the-counter diclofenac gel -Consider elastic knee sleeve for some additional support =-Avoid bending at knees and avoid extreme inclines with treadmill and if not better in a couple weeks consider sports medicine referral  No orders of the defined types were placed in this encounter.   Follow-up: No follow-ups on file.    Evelena Peat, MD

## 2020-06-23 NOTE — Patient Instructions (Signed)
Ice knee 15-20 minutes 2-3 times daily  Consider OTC Diclofenac gel 3 times daily  Consider elastic knee sleeve  Avoid squatting and avoid steep inclines  If not better in 2-3 weeks we might want to consider sports medicine referral.

## 2020-06-25 ENCOUNTER — Other Ambulatory Visit: Payer: Self-pay

## 2020-06-25 ENCOUNTER — Telehealth: Payer: Self-pay | Admitting: Family Medicine

## 2020-06-25 DIAGNOSIS — M25561 Pain in right knee: Secondary | ICD-10-CM

## 2020-06-25 NOTE — Telephone Encounter (Signed)
Patient had a visit with Dr. Caryl Never recently and said that he would refer her to sports medicine if she would like. She says that she would like a referral placed for sports medicine.  Please advise.

## 2020-06-25 NOTE — Telephone Encounter (Signed)
Referral placed. Spoke with the patient, she is aware her referral has been placed. Nothing further needed.

## 2020-06-25 NOTE — Telephone Encounter (Signed)
Please advise. Ok for referral? 

## 2020-07-02 NOTE — Progress Notes (Signed)
I, Donna Shaw, LAT, ATC acting as a scribe for Donna Graham, MD.  Subjective:    I'm seeing this patient as a consultation for: Dr. Evelena Shaw. Note will be routed back to referring provider/PCP.  CC: Right knee pain  HPI: Pt is a 65 y/o female c/o R knee pain x 4 weeks. MOI: Pt noticed R knee pain after walking on a treadmill and felt a sudden pain. This was after going faster on the treadmill and changing incline.  She has had a sensation of "buckling "occasionally" since then. Pt notes pain and buckling is intermittent. Pt locates pain to the anterior aspect of the R knee.  R knee swelling: no Aggravates: random Treatments tried: ice, diclofenac gel, compression sleeve  Past medical history, Surgical history, Family history, Social history, Allergies, and medications have been entered into the medical record, reviewed.   Review of Systems: No new headache, visual changes, nausea, vomiting, diarrhea, constipation, dizziness, abdominal pain, skin rash, fevers, chills, night sweats, weight loss, swollen lymph nodes, body aches, joint swelling, muscle aches, chest pain, shortness of breath, mood changes, visual or auditory hallucinations.   Objective:    Vitals:   07/05/20 1525  BP: 136/84  Pulse: (!) 104  SpO2: 98%   General: Well Developed, well nourished, and in no acute distress.  Neuro/Psych: Alert and oriented x3, extra-ocular muscles intact, able to move all 4 extremities, sensation grossly intact. Skin: Warm and dry, no rashes noted.  Respiratory: Not using accessory muscles, speaking in full sentences, trachea midline.  Cardiovascular: Pulses palpable, no extremity edema. Abdomen: Does not appear distended. MSK: Right knee decreased but quadricep bulk otherwise normal. Normal motion with minimal crepitation. Mildly tender palpation medial joint line. Stable ligamentous exam. Slight decrease strength in knee extension. Negative McMurray's test.  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 Await formal radiology review  Diagnostic Limited MSK Ultrasound of: Right knee Quad tendon intact. Trace hyperechoic change quad tendon insertion superior portion of patella. Patellar tendon normal-appearing Medial joint line narrowed degenerative appearing Lateral joint line normal-appearing Posterior knee no Baker's cyst. 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  Mild medial compartment DJD.  Procedure: Real-time Ultrasound Guided Injection of right knee superior lateral patellar space Device: Philips Affiniti 50G Images permanently stored and available for review in PACS Verbal informed consent obtained.  Discussed risks and benefits of procedure. Warned about infection bleeding damage to structures skin hypopigmentation and fat atrophy among others. Patient expresses understanding and agreement Time-out conducted.   Noted no overlying erythema, induration, or other signs of local infection.   Skin prepped in a sterile fashion.   Local anesthesia: Topical Ethyl chloride.   With sterile technique and under real time ultrasound guidance: 40 mg of Kenalog and 2 mL of Marcaine injected into knee joint. Fluid seen entering the joint capsule.   Completed without difficulty   Pain immediately resolved suggesting accurate placement of the medication.   Advised to call if fevers/chills, erythema, induration, drainage, or persistent bleeding.   Images permanently stored and available for review in the ultrasound unit.  Impression: Technically successful ultrasound guided injection.       Impression and Recommendations:    Assessment and Plan: 66 y.o. female with right knee pain.  Patient does have some minimal quad tendinitis and some mild degenerative changes.  Dominant issue seems to be quad muscle  weakness.  She should do quite well with quad strengthening physical therapy.  Plan to treat with PT and Voltaren gel and steroid injection right knee.  Recheck in about 6 weeks.  Return sooner if needed.  PDMP not reviewed this encounter. Orders Placed This Encounter  Procedures   [place] LIMITED JOINT SPACE STRUCTURES LOW RIGHT(NO LINKED CHARGES)    Standing Status:   Future    Number of Occurrences:   1    Standing Expiration Date:   01/04/2021    Order Specific Question:   Reason for Exam (SYMPTOM  OR DIAGNOSIS REQUIRED)    Answer:   right knee pain    Order Specific Question:   Preferred imaging location?    Answer:    [facility] Knee AP/LAT W/[place] Right    Standing Status:   Future    Number of Occurrences:   1    Standing Expiration Date:   07/05/2021    Order Specific Question:   Reason for Exam (SYMPTOM  OR DIAGNOSIS REQUIRED)    Answer:   right knee pain    Order Specific Question:   Preferred imaging location?    Answer:   [facility]   Ambulatory referral to Physical Therapy    Referral Priority:   Routine    Referral Type:   Physical Medicine    Referral Reason:   Specialty Services Required    Requested Specialty:   Physical Therapy    Number of Visits Requested:   1   No orders of the defined types were placed in this encounter.   Discussed warning signs or symptoms. Please see discharge instructions. Patient expresses understanding.   The above documentation has been reviewed and is accurate and complete Donna Shaw, M.D.

## 2020-07-05 ENCOUNTER — Other Ambulatory Visit: Payer: Self-pay

## 2020-07-05 ENCOUNTER — Ambulatory Visit: Payer: BC Managed Care – PPO | Admitting: Family Medicine

## 2020-07-05 ENCOUNTER — Ambulatory Visit: Payer: Self-pay

## 2020-07-05 ENCOUNTER — Ambulatory Visit (INDEPENDENT_AMBULATORY_CARE_PROVIDER_SITE_OTHER): Payer: BC Managed Care – PPO

## 2020-07-05 VITALS — BP 136/84 | HR 104 | Ht 63.0 in | Wt 186.4 lb

## 2020-07-05 DIAGNOSIS — M25561 Pain in right knee: Secondary | ICD-10-CM

## 2020-07-05 NOTE — Patient Instructions (Addendum)
Thank you for coming in today.   Please get an Xray today before you leave   Call or go to the ER if you develop a large red swollen joint with extreme pain or oozing puss.    I've referred you to Physical Therapy.  Let us know if you don't hear from them in one week.   Recheck in 6 weeks.   Please use Voltaren gel (Generic Diclofenac Gel) up to 4x daily for pain as needed.  This is available over-the-counter as both the name brand Voltaren gel and the generic diclofenac gel.

## 2020-07-06 ENCOUNTER — Encounter: Payer: Self-pay | Admitting: Family Medicine

## 2020-07-06 NOTE — Progress Notes (Signed)
X-ray right knee shows mild arthritis.

## 2020-07-13 ENCOUNTER — Ambulatory Visit (INDEPENDENT_AMBULATORY_CARE_PROVIDER_SITE_OTHER): Payer: Medicare PPO | Admitting: Physical Therapy

## 2020-07-13 ENCOUNTER — Other Ambulatory Visit: Payer: Self-pay

## 2020-07-13 ENCOUNTER — Encounter: Payer: Self-pay | Admitting: Physical Therapy

## 2020-07-13 DIAGNOSIS — R2689 Other abnormalities of gait and mobility: Secondary | ICD-10-CM

## 2020-07-13 DIAGNOSIS — M25561 Pain in right knee: Secondary | ICD-10-CM

## 2020-07-13 NOTE — Patient Instructions (Signed)
Access Code: [email or web address] URL: [email or web address] Date: 07/13/2020 Prepared by: Sedalia Muta  Exercises Supine Quadricep Sets - 2 x daily - 1-2 sets - 10 reps Seated Knee Extension AROM - 2 x daily - 2 sets - 10 reps Side to Side Weight Shift with Counter Support - 2 x daily - 2 sets - 10 reps Staggered Stance Forward Backward Weight Shift with Counter Support - 2 x daily - 2 sets - 10 reps

## 2020-07-14 ENCOUNTER — Encounter: Payer: Self-pay | Admitting: Physical Therapy

## 2020-07-18 ENCOUNTER — Encounter: Payer: Self-pay | Admitting: Physical Therapy

## 2020-07-18 NOTE — Therapy (Signed)
[facility] [address] [place], [place], [address] Phone: [phone]   Fax:  [phone]  Physical Therapy Evaluation  Patient Details  Name: Donna Shaw MRN: [id number] Date of Birth: 1955/06/02 Referring Provider (PT): Clementeen Graham   Encounter Date: 07/13/2020   PT End of Session - 07/18/20 1016     Number of Visits 12    Date for PT Re-Evaluation 08/24/20    Authorization Type [employer]    PT Start Time 1602    PT Stop Time 1643    PT Time Calculation (min) 41 min    Activity Tolerance Patient tolerated treatment well    Behavior During Therapy [facility] for tasks assessed/performed             Past Medical History:  Diagnosis Date   Arthritis    osteoarthritis neck   Chicken pox     Past Surgical History:  Procedure Laterality Date   MOUTH SURGERY     TUBAL LIGATION      There were no vitals filed for this visit.    Subjective Assessment - 07/18/20 1015     Subjective Knee pain for about 1 month, when she started back walking on treadmill on an incline. Pain in R knee. No previous pain. Pain up to 7/10, stairs painful. She is retired, would like to get back to exercise/walking. Pt using SPC for stability, did not use prior to knee pain. Has had improved pain since injection on 6/27.    Limitations Standing;Walking;House hold activities    Patient Stated Goals decreased pain, increase walking    Currently in Pain? Yes    Pain Score 3     Pain Location Knee    Pain Orientation Right    Pain Descriptors / Indicators Aching    Pain Type Acute pain    Pain Onset More than a month ago    Pain Frequency Intermittent    Aggravating Factors  standing , walking, bending    Pain Relieving Factors injection                [facility] PT Assessment - 07/18/20 0001       Assessment   Medical Diagnosis R knee pain    Referring Provider (PT) Clementeen Graham    Prior Therapy no      Precautions   Precautions None       Prior Function   Level of Independence Independent      Cognition   Overall Cognitive Status Within Functional Limits for tasks assessed      Posture/Postural Control   Posture Comments mild fwd head and rounded shoulders,      AROM   Overall AROM Comments Knee: WNL, Hips: WNL      Strength   Overall Strength Comments R knee: 4/5, L: 5/5,  Hips: 4-/5 bil;      Flexibility   Soft Tissue Assessment /Muscle Length yes    Quadriceps limited on R; prone      Palpation   Palpation comment Tightness in mid to distal quad on R; tenderness at distal quad and into anteiror knee,mild tenderness of patella tendon today      Ambulation/Gait   Stairs Yes    Gait Comments 35 ft x8 with SPC, education for heel strike and push off, as well as increasing knee flexion and extension, sequencing with SPC.  Objective measurements completed on examination: See above findings.       [facility] Adult PT Treatment/Exercise - 07/18/20 0001       Exercises   Exercises Knee/Hip      Knee/Hip Exercises: Standing   Other Standing Knee Exercises L/R and staggered stance weight shifts x 10 each bil; cuing for full weight bearing with straight knee on R;      Knee/Hip Exercises: Seated   Long Arc Quad 15 reps;Both      Knee/Hip Exercises: Supine   Quad Sets 15 reps;Both                    PT Education - 07/18/20 1016     Education Details PT POC, Exam findings, HEP    Person(s) Educated Patient    Methods Explanation;Demonstration;Tactile cues;Verbal cues;Handout    Comprehension Verbalized understanding;Returned demonstration;Verbal cues required;Tactile cues required;Need further instruction              PT Short Term Goals - 07/18/20 1016       PT SHORT TERM GOAL #1   Title Pt to be independent with initial HEP    Time 2    Period Weeks    Status New    Target Date 07/27/20      PT SHORT TERM GOAL #2   Title Pt to demo ability for  ambulation without SPC for at least 100 ft    Time 3    Period Weeks    Status New    Target Date 08/03/20               PT Long Term Goals - 07/18/20 1016       PT LONG TERM GOAL #1   Title Pt to be independent with final HEP    Time 8    Period Weeks    Status New    Target Date 09/07/20      PT LONG TERM GOAL #2   Title Pt to report decreased pain in R knee to 0-/10 with activity and IADLS.    Time 8    Period Weeks    Status New    Target Date 09/07/20      PT LONG TERM GOAL #3   Title Pt to demo improved strength of R knee to at least 4+/5 to improve stability, gait, and stair ability    Time 8    Period Weeks    Status New    Target Date 09/07/20      PT LONG TERM GOAL #4   Title Pt to demo ability for recipricol stair climbing, with 1 HR, to improve ability for home and community navigation.    Time 8    Period Weeks    Status New    Target Date 09/07/20      PT LONG TERM GOAL #5   Title Pt to demo improved gait mechanics, to be WNL for pt age, without AD, for at least 500 ft, to improve ability for community activities and exercise.    Time 8    Period Weeks    Status New    Target Date 09/07/20                    Plan - 07/18/20 1018     Clinical Impression Statement Pt presents with primary complaint of increased pain in R knee. She has minimal ROM deficits, but does have apprehension for movment. She has decreased strength  in R knee and hip. Pt with hesitancy for full weight bearing, as well as gait deficits, with decreased heel strike, decreased knee flex and extension, and requires use of SPC at this time. Gait mechanics imroved after education and practice today. Pt with increased pain and deficits that are effecting functional moblity and activity level. Pt to benefit from skilled PT to improve deficits and return to PLOF without pain.    Examination-Activity Limitations Stand;Locomotion Level;Stairs;Squat;Carry;Bend;Transfers     Examination-Participation Restrictions Cleaning;Meal Prep;Yard Work;Community Activity;Driving;Laundry;Shop    Stability/Clinical Decision Making Stable/Uncomplicated    Clinical Decision Making Low    Rehab Potential Good    PT Frequency 2x / week    PT Duration 6 weeks    PT Treatment/Interventions ADLs/Self Care Home Management;Cryotherapy;Psychologist, educational;Iontophoresis 4mg /ml Dexamethasone;Moist Heat;Ultrasound;Gait training;Stair training;Functional mobility training;Therapeutic activities;Therapeutic exercise;Balance training;Orthotic Fit/Training;Patient/family education;Neuromuscular re-education;Manual techniques;Passive range of motion;Dry needling;Energy conservation;Taping;Vasopneumatic Device;Joint Manipulations;Spinal Manipulations    PT Home Exercise Plan    Consulted and Agree with Plan of Care Patient             Patient will benefit from skilled therapeutic intervention in order to improve the following deficits and impairments:  Abnormal gait, Pain, Improper body mechanics, Decreased mobility, Increased muscle spasms  Visit Diagnosis: Acute pain of right knee  Other abnormalities of gait and mobility     Problem List Patient Active Problem List   Diagnosis Date Noted   Osteoarthritis of neck 01/22/2012   Raynauds phenomenon 01/22/2012   01/24/2012, PT, DPT 10:42 AM  07/18/20    [facility] [address] [place], [place], [place] Phone: [phone]   Fax:  [phone]  Name: Donna Shaw MRN: Evonnie Pat Date of Birth: [birth year]

## 2020-07-19 ENCOUNTER — Encounter: Payer: Self-pay | Admitting: Physical Therapy

## 2020-07-19 ENCOUNTER — Other Ambulatory Visit: Payer: Self-pay

## 2020-07-19 ENCOUNTER — Ambulatory Visit: Payer: Medicare PPO | Admitting: Physical Therapy

## 2020-07-19 DIAGNOSIS — M25561 Pain in right knee: Secondary | ICD-10-CM | POA: Diagnosis not present

## 2020-07-19 DIAGNOSIS — R2689 Other abnormalities of gait and mobility: Secondary | ICD-10-CM | POA: Diagnosis not present

## 2020-07-19 NOTE — Therapy (Signed)
[facility] [address] [place], [place], [address] Phone: [phone]   Fax:  [phone]  Physical Therapy Treatment  Patient Details  Name: Donna Shaw MRN: [id number] Date of Birth: 28-Oct-1955 Referring Provider (PT): Clementeen Graham   Encounter Date: 07/19/2020   PT End of Session - 07/19/20 2127     Visit Number 2    Number of Visits 12    Date for PT Re-Evaluation 08/24/20    Authorization Type [employer]    PT Start Time 1601    PT Stop Time 1643    PT Time Calculation (min) 42 min    Activity Tolerance Patient tolerated treatment well    Behavior During Therapy [facility] for tasks assessed/performed             Past Medical History:  Diagnosis Date   Arthritis    osteoarthritis neck   Chicken pox     Past Surgical History:  Procedure Laterality Date   MOUTH SURGERY     TUBAL LIGATION      There were no vitals filed for this visit.   Subjective Assessment - 07/19/20 2125     Subjective Pt states she is getting around better. Knee still sore. Has been doing HEP.    Currently in Pain? Yes    Pain Score 3     Pain Location Knee    Pain Orientation Right    Pain Descriptors / Indicators Aching    Pain Type Acute pain    Pain Onset More than a month ago    Pain Frequency Intermittent                               [facility] Adult PT Treatment/Exercise - 07/19/20 0001       Ambulation/Gait   Stairs --    Gait Comments 35 ft x8 without AD education for heel strike and increasing knee flexion and extension,      Posture/Postural Control   Posture Comments mild fwd head and rounded shoulders,      Exercises   Exercises Knee/Hip      Knee/Hip Exercises: Aerobic   Recumbent Bike L1 x 3 min   pain in knee     Knee/Hip Exercises: Standing   Heel Raises 15 reps    Heel Raises Limitations with post weight shift    Forward Step Up 10 reps;Step Height: 4";Hand Hold: 1;Step Height: 6"    Stairs  up/down 3 steps, 4 in , 1 HR x 4;    Other Standing Knee Exercises Pre-gait, ant/posterior stepping with weight shifts x 20 bil; bwd walking 10 ft x 6, with UE support at wall    Other Standing Knee Exercises Marching x 20;      Knee/Hip Exercises: Seated   Long Arc Quad Both;20 reps      Knee/Hip Exercises: Supine   Quad Sets Both;20 reps    Straight Leg Raises 10 reps;Both                      PT Short Term Goals - 07/18/20 1016       PT SHORT TERM GOAL #1   Title Pt to be independent with initial HEP    Time 2    Period Weeks    Status New    Target Date 07/27/20      PT SHORT TERM GOAL #2   Title Pt  to demo ability for ambulation without SPC for at least 100 ft    Time 3    Period Weeks    Status New    Target Date 08/03/20               PT Long Term Goals - 07/18/20 1016       PT LONG TERM GOAL #1   Title Pt to be independent with final HEP    Time 8    Period Weeks    Status New    Target Date 09/07/20      PT LONG TERM GOAL #2   Title Pt to report decreased pain in R knee to 0-/10 with activity and IADLS.    Time 8    Period Weeks    Status New    Target Date 09/07/20      PT LONG TERM GOAL #3   Title Pt to demo improved strength of R knee to at least 4+/5 to improve stability, gait, and stair ability    Time 8    Period Weeks    Status New    Target Date 09/07/20      PT LONG TERM GOAL #4   Title Pt to demo ability for recipricol stair climbing, with 1 HR, to improve ability for home and community navigation.    Time 8    Period Weeks    Status New    Target Date 09/07/20      PT LONG TERM GOAL #5   Title Pt to demo improved gait mechanics, to be WNL for pt age, without AD, for at least 500 ft, to improve ability for community activities and exercise.    Time 8    Period Weeks    Status New    Target Date 09/07/20                   Plan - 07/19/20 2132     Clinical Impression Statement Pt with much improvement  in mobility today. She has improved gait mechanics, improved speed, and was able to walk short distances safely without use of SPC. She was able to progress ther ex without increased pain. Pt with improved ability and tolerance for full weight acceptance on R LE. Plan to progress strength , stability, and gait as tolerated.    Examination-Activity Limitations Stand;Locomotion Level;Stairs;Squat;Carry;Bend;Transfers    Examination-Participation Restrictions Cleaning;Meal Prep;Yard Work;Community Activity;Driving;Laundry;Shop    Stability/Clinical Decision Making Stable/Uncomplicated    Rehab Potential Good    PT Frequency 2x / week    PT Duration 6 weeks    PT Treatment/Interventions ADLs/Self Care Home Management;Cryotherapy;Psychologist, educational;Iontophoresis 4mg /ml Dexamethasone;Moist Heat;Ultrasound;Gait training;Stair training;Functional mobility training;Therapeutic activities;Therapeutic exercise;Balance training;Orthotic Fit/Training;Patient/family education;Neuromuscular re-education;Manual techniques;Passive range of motion;Dry needling;Energy conservation;Taping;Vasopneumatic Device;Joint Manipulations;Spinal Manipulations    PT Home Exercise Plan    Consulted and Agree with Plan of Care Patient             Patient will benefit from skilled therapeutic intervention in order to improve the following deficits and impairments:  Abnormal gait, Pain, Improper body mechanics, Decreased mobility, Increased muscle spasms  Visit Diagnosis: Acute pain of right knee  Other abnormalities of gait and mobility     Problem List Patient Active Problem List   Diagnosis Date Noted   Osteoarthritis of neck 01/22/2012   Raynauds phenomenon 01/22/2012   01/24/2012, PT, DPT 9:43 PM  07/19/20    [facility] [address]  [place], [place], [address] Phone: [phone]   Fax:  [phone]  Name: Donna Shaw MRN: [id number] Date of Birth: August 19, 1955

## 2020-07-22 ENCOUNTER — Ambulatory Visit: Payer: Medicare PPO | Admitting: Physical Therapy

## 2020-07-22 ENCOUNTER — Encounter: Payer: Self-pay | Admitting: Physical Therapy

## 2020-07-22 ENCOUNTER — Other Ambulatory Visit: Payer: Self-pay

## 2020-07-22 DIAGNOSIS — R2689 Other abnormalities of gait and mobility: Secondary | ICD-10-CM | POA: Diagnosis not present

## 2020-07-22 DIAGNOSIS — M25561 Pain in right knee: Secondary | ICD-10-CM

## 2020-07-23 NOTE — Therapy (Signed)
[facility] [address] [place], [place], [address] Phone: [phone]   Fax:  [phone]  Physical Therapy Treatment  Patient Details  Name: Donna Shaw MRN: [id number] Date of Birth: 04/27/1955 Referring Provider (PT): Clementeen Graham   Encounter Date: 07/22/2020   PT End of Session - 07/22/20 1447     Visit Number 3    Number of Visits 12    Date for PT Re-Evaluation 08/24/20    Authorization Type [employer]    PT Start Time 1436    PT Stop Time 1515    PT Time Calculation (min) 39 min    Activity Tolerance Patient tolerated treatment well    Behavior During Therapy [facility] for tasks assessed/performed             Past Medical History:  Diagnosis Date   Arthritis    osteoarthritis neck   Chicken pox     Past Surgical History:  Procedure Laterality Date   MOUTH SURGERY     TUBAL LIGATION      There were no vitals filed for this visit.   Subjective Assessment - 07/22/20 1446     Subjective Pt states a bit more pain in last couple days. Has been doing HEP.    Currently in Pain? Yes    Pain Score 5     Pain Location Knee    Pain Orientation Right    Pain Descriptors / Indicators Sore    Pain Type Acute pain    Pain Onset More than a month ago    Pain Frequency Intermittent                               [facility] Adult PT Treatment/Exercise - 07/23/20 0001       Ambulation/Gait   Gait Comments 35 ft x8 without AD education for heel strike , increasing knee flexion and extension,      Posture/Postural Control   Posture Comments mild fwd head and rounded shoulders,      Exercises   Exercises Knee/Hip      Knee/Hip Exercises: Stretches   Gastroc Stretch 3 reps;30 seconds      Knee/Hip Exercises: Standing   Heel Raises 15 reps    Heel Raises Limitations with post weight shift    Forward Step Up 10 reps;Step Height: 4";Hand Hold: 1;Step Height: 6"    Stairs up/down 3 steps, 4 in , 1 HR x 4;     Other Standing Knee Exercises Pre-gait, ant/posterior stepping with weight shifts x 20 bil; bwd walking 10 ft x 6, with UE support at wall    Other Standing Knee Exercises Marching x 20;      Knee/Hip Exercises: Seated   Long Arc Quad Both;20 reps    Sit to [employer] 5 reps      Knee/Hip Exercises: Supine   Quad Sets Both;20 reps    Bridges 10 reps    Straight Leg Raises Both;20 reps      Manual Therapy   Manual Therapy Taping    Kinesiotex Create Space      Kinesiotix   Create Space 2 I strips, for patella tendon decompression                    PT Education - 07/22/20 1447     Education Details Reviewed HEP    Person(s) Educated Patient    Methods Explanation;Demonstration;Verbal cues  Comprehension Verbalized understanding;Returned demonstration;Verbal cues required;Need further instruction              PT Short Term Goals - 07/18/20 1016       PT SHORT TERM GOAL #1   Title Pt to be independent with initial HEP    Time 2    Period Weeks    Status New    Target Date 07/27/20      PT SHORT TERM GOAL #2   Title Pt to demo ability for ambulation without SPC for at least 100 ft    Time 3    Period Weeks    Status New    Target Date 08/03/20               PT Long Term Goals - 07/18/20 1016       PT LONG TERM GOAL #1   Title Pt to be independent with final HEP    Time 8    Period Weeks    Status New    Target Date 09/07/20      PT LONG TERM GOAL #2   Title Pt to report decreased pain in R knee to 0-/10 with activity and IADLS.    Time 8    Period Weeks    Status New    Target Date 09/07/20      PT LONG TERM GOAL #3   Title Pt to demo improved strength of R knee to at least 4+/5 to improve stability, gait, and stair ability    Time 8    Period Weeks    Status New    Target Date 09/07/20      PT LONG TERM GOAL #4   Title Pt to demo ability for recipricol stair climbing, with 1 HR, to improve ability for home and community  navigation.    Time 8    Period Weeks    Status New    Target Date 09/07/20      PT LONG TERM GOAL #5   Title Pt to demo improved gait mechanics, to be WNL for pt age, without AD, for at least 500 ft, to improve ability for community activities and exercise.    Time 8    Period Weeks    Status New    Target Date 09/07/20                   Plan - 07/23/20 1520     Clinical Impression Statement Pt with mild soreness in knee today, at anterior knee and fat pad. Pt still able to do well with light strengthening today. Continues to have stiff gait with decreased speed and fluidity, will benefit from continued practice with this. Added k-tape to anterior knee for pain at end of session, educated on use and precautions with tape. Pt to benefit from continued care.    Examination-Activity Limitations Stand;Locomotion Level;Stairs;Squat;Carry;Bend;Transfers    Examination-Participation Restrictions Cleaning;Meal Prep;Yard Work;Community Activity;Driving;Laundry;Shop    Stability/Clinical Decision Making Stable/Uncomplicated    Rehab Potential Good    PT Frequency 2x / week    PT Duration 6 weeks    PT Treatment/Interventions ADLs/Self Care Home Management;Cryotherapy;Psychologist, educational;Iontophoresis 4mg /ml Dexamethasone;Moist Heat;Ultrasound;Gait training;Stair training;Functional mobility training;Therapeutic activities;Therapeutic exercise;Balance training;Orthotic Fit/Training;Patient/family education;Neuromuscular re-education;Manual techniques;Passive range of motion;Dry needling;Energy conservation;Taping;Vasopneumatic Device;Joint Manipulations;Spinal Manipulations    PT Home Exercise Plan    Consulted and Agree with Plan of Care Patient             Patient will benefit  from skilled therapeutic intervention in order to improve the following deficits and impairments:  Abnormal gait, Pain, Improper body mechanics, Decreased mobility, Increased  muscle spasms  Visit Diagnosis: Acute pain of right knee  Other abnormalities of gait and mobility     Problem List Patient Active Problem List   Diagnosis Date Noted   Osteoarthritis of neck 01/22/2012   Raynauds phenomenon 01/22/2012    Sedalia Muta, PT, DPT 3:22 PM  07/23/20    [facility] [address] [place], [place], [address] Phone: [phone]   Fax:  [phone]  Name: Donna Shaw MRN: [id number] Date of Birth: October 16, 1955   Eval Date: 7/5   8 weeks:  09/07/20  Referring diagnosis?  R knee pain,  gait (abnormal gait )  Treatment diagnosis? (if different than referring diagnosis)  What was this (referring dx) caused by? []  Surgery []  Fall []  Ongoing issue []  Arthritis [x]  Other: _______normal wear and tear , no injury_____  Laterality: [x]  Rt []  Lt []  Both  Check all possible CPT codes:      [x]  97110 (Therapeutic Exercise)  []  92507 (SLP Treatment)  [x]  (Neuro Re-ed)   []  92526 (Swallowing Treatment)   [x]  (Gait Training)   []  (Cognitive Training, 1st 15 minutes) [x]  97140 (Manual Therapy)   []  97130 (Cognitive Training, each add'l 15 minutes)  [x]  97530 (Therapeutic Activities)  []  Other, List CPT Code ____________    [x]  97535 (Self Care)       []  All codes above (97110 - 97535)  []  97012 (Mechanical Traction)  []  97014 (E-stim Unattended)  []  97032 (E-stim manual)  [x]  97033 (Ionto)  []  97035 (Ultrasound)  []  97760 (Orthotic Fit) []  (Physical Performance Training) []  (Aquatic Therapy) []  97034 (Contrast Bath) []  (Paraffin) []  97597 (Wound Care 1st 20 sq cm) []  97598 (Wound Care each add'l 20 sq cm) []  97016 (Vasopneumatic Device) []   ) []  (Prosthetic Training)

## 2020-07-26 ENCOUNTER — Encounter: Payer: Self-pay | Admitting: Physical Therapy

## 2020-07-28 ENCOUNTER — Other Ambulatory Visit: Payer: Self-pay

## 2020-07-28 ENCOUNTER — Ambulatory Visit: Payer: Medicare PPO | Admitting: Physical Therapy

## 2020-07-28 ENCOUNTER — Telehealth (INDEPENDENT_AMBULATORY_CARE_PROVIDER_SITE_OTHER): Payer: Medicare PPO | Admitting: Family Medicine

## 2020-07-28 ENCOUNTER — Encounter: Payer: Self-pay | Admitting: Physical Therapy

## 2020-07-28 DIAGNOSIS — R300 Dysuria: Secondary | ICD-10-CM

## 2020-07-28 DIAGNOSIS — M25561 Pain in right knee: Secondary | ICD-10-CM

## 2020-07-28 DIAGNOSIS — R2689 Other abnormalities of gait and mobility: Secondary | ICD-10-CM

## 2020-07-28 MED ORDER — NITROFURANTOIN MONOHYD MACRO 100 MG PO CAPS: 100.0000 mg | ORAL_CAPSULE | Freq: Two times a day (BID) | ORAL | 0 refills | Status: DC

## 2020-07-28 NOTE — Therapy (Signed)
[facility] [address] [place], [place], [address] Phone: [phone]   Fax:  [phone]  Physical Therapy Treatment  Patient Details  Name: Donna Shaw MRN: [id number] Date of Birth: Aug 10, 1955 Referring Provider (PT): Clementeen Graham   Encounter Date: 07/28/2020   PT End of Session - 07/28/20 1614     Visit Number 4    Number of Visits 12    Date for PT Re-Evaluation 08/24/20    Authorization Type [employer]    PT Start Time 1517    PT Stop Time 1557    PT Time Calculation (min) 40 min    Activity Tolerance Patient tolerated treatment well    Behavior During Therapy [facility] for tasks assessed/performed             Past Medical History:  Diagnosis Date   Arthritis    osteoarthritis neck   Chicken pox     Past Surgical History:  Procedure Laterality Date   MOUTH SURGERY     TUBAL LIGATION      There were no vitals filed for this visit.   Subjective Assessment - 07/28/20 1613     Subjective Pt states improving pain, but knee still sore.    Currently in Pain? Yes    Pain Score 4     Pain Location Knee    Pain Orientation Right    Pain Descriptors / Indicators Aching    Pain Type Acute pain    Pain Onset More than a month ago    Pain Frequency Intermittent                               [facility] Adult PT Treatment/Exercise - 07/28/20 0001       Ambulation/Gait   Gait Comments 35 ft x 10 without AD education for heel strike , increasing knee flexion and extension,      Posture/Postural Control   Posture Comments mild fwd head and rounded shoulders,      Exercises   Exercises Knee/Hip      Knee/Hip Exercises: Stretches   Active Hamstring Stretch 3 reps;30 seconds    Active Hamstring Stretch Limitations seated    Gastroc Stretch --      Knee/Hip Exercises: Aerobic   Recumbent Bike L 1 x 5 min      Knee/Hip Exercises: Standing   Heel Raises --    Heel Raises Limitations --    Forward  Step Up 10 reps;Step Height: 4";Hand Hold: 1    Stairs up/down 3 steps, 6 in , 1 HR x 4;    Other Standing Knee Exercises ; bwd walking 10 ft x 6,    Other Standing Knee Exercises --      Knee/Hip Exercises: Seated   Long Arc Quad --    Sit to [employer] 10 reps;with UE support   light UE support from reg arm chair.     Knee/Hip Exercises: Supine   Quad Sets Both;20 reps    Bridges --    Straight Leg Raises Both;20 reps      Manual Therapy   Manual Therapy Taping;Joint mobilization;Soft tissue mobilization;Passive ROM    Soft tissue mobilization IASTM for distal quad for pain and tightness    Passive ROM For knee flexion, pain at end range of flexion    Kinesiotex Create Space      Kinesiotix   Create Space 2 I strips, for patella tendon  decompression                      PT Short Term Goals - 07/18/20 1016       PT SHORT TERM GOAL #1   Title Pt to be independent with initial HEP    Time 2    Period Weeks    Status New    Target Date 07/27/20      PT SHORT TERM GOAL #2   Title Pt to demo ability for ambulation without SPC for at least 100 ft    Time 3    Period Weeks    Status New    Target Date 08/03/20               PT Long Term Goals - 07/18/20 1016       PT LONG TERM GOAL #1   Title Pt to be independent with final HEP    Time 8    Period Weeks    Status New    Target Date 09/07/20      PT LONG TERM GOAL #2   Title Pt to report decreased pain in R knee to 0-/10 with activity and IADLS.    Time 8    Period Weeks    Status New    Target Date 09/07/20      PT LONG TERM GOAL #3   Title Pt to demo improved strength of R knee to at least 4+/5 to improve stability, gait, and stair ability    Time 8    Period Weeks    Status New    Target Date 09/07/20      PT LONG TERM GOAL #4   Title Pt to demo ability for recipricol stair climbing, with 1 HR, to improve ability for home and community navigation.    Time 8    Period Weeks    Status New     Target Date 09/07/20      PT LONG TERM GOAL #5   Title Pt to demo improved gait mechanics, to be WNL for pt age, without AD, for at least 500 ft, to improve ability for community activities and exercise.    Time 8    Period Weeks    Status New    Target Date 09/07/20                   Plan - 07/28/20 1616     Clinical Impression Statement Pt showing improvments in pain. Pain is less intense, and less frequent. She has been increasing walking, inside, without SPC, and is doing much better with speed and gait mechanics today. She does have pain at end range for flexion, quad stretching and IASTM done to improve. Pt to benefit form continued progression of strength as able.    Examination-Activity Limitations Stand;Locomotion Level;Stairs;Squat;Carry;Bend;Transfers    Examination-Participation Restrictions Cleaning;Meal Prep;Yard Work;Community Activity;Driving;Laundry;Shop    Stability/Clinical Decision Making Stable/Uncomplicated    Rehab Potential Good    PT Frequency 2x / week    PT Duration 6 weeks    PT Treatment/Interventions ADLs/Self Care Home Management;Cryotherapy;Psychologist, educational;Iontophoresis 4mg /ml Dexamethasone;Moist Heat;Ultrasound;Gait training;Stair training;Functional mobility training;Therapeutic activities;Therapeutic exercise;Balance training;Orthotic Fit/Training;Patient/family education;Neuromuscular re-education;Manual techniques;Passive range of motion;Dry needling;Energy conservation;Taping;Vasopneumatic Device;Joint Manipulations;Spinal Manipulations    PT Home Exercise Plan    Consulted and Agree with Plan of Care Patient             Patient will benefit from skilled therapeutic intervention in order  to improve the following deficits and impairments:  Abnormal gait, Pain, Improper body mechanics, Decreased mobility, Increased muscle spasms  Visit Diagnosis: Acute pain of right knee  Other abnormalities of gait and  mobility     Problem List Patient Active Problem List   Diagnosis Date Noted   Osteoarthritis of neck 01/22/2012   Raynauds phenomenon 01/22/2012    Sedalia Muta, PT, DPT 4:18 PM  07/28/20    [facility] [place] [facility] [address] [place], [place], [address] Phone: [phone]   Fax:  [phone]  Name: ALAILA PILLARD MRN: [id number] Date of Birth: [birth year]

## 2020-07-28 NOTE — Progress Notes (Signed)
Patient ID: GWENYTH DINGEE, female   DOB: Oct 08, 1955, 65 y.o.   MRN: [id number]  This visit type was conducted due to national recommendations for restrictions regarding the COVID-19 pandemic in an effort to limit this patient's exposure and mitigate transmission in our community.   Virtual Visit via Video Note  I connected with Bonney Aid On 07/28/20 at  5:30 PM EDT by a video enabled telemedicine application and verified that I am speaking with the correct person using two identifiers.  Location patient: home Location provider:work or home office Persons participating in the virtual visit: patient, provider  I discussed the limitations of evaluation and management by telemedicine and the availability of in person appointments. The patient expressed understanding and agreed to proceed.   HPI:  Itza relates onset last night of some urine frequency and mild burning with urination.  She has some mild suprapubic discomfort.  Similar symptoms with UTI in the past.  No fevers or chills.  No nausea or vomiting.  No flank pain.  She has history of intolerance with Septra.  Has tolerated Macrobid well.  Last UTI apparently May 2021.  No recent hospitalization.   ROS: See pertinent positives and negatives per HPI.  Past Medical History:  Diagnosis Date   Arthritis    osteoarthritis neck   Chicken pox     Past Surgical History:  Procedure Laterality Date   MOUTH SURGERY     TUBAL LIGATION      Family History  Problem Relation Age of Onset   Cancer Mother        lung   Cancer Father        colon   Diabetes Brother     SOCIAL HX: Non-smoker   Current Outpatient Medications:    diclofenac sodium (VOLTAREN) 1 % GEL, Apply 2 g topically 4 (four) times daily. Please schedule a physical for more refills. thanks, Disp: 100 g, Rfl: 0   ELESTRIN 0.52 MG/0.87 GM (0.06%) GEL, Apply 1 application topically daily. To upper arm per Dr Ernestina Penna, Disp: , Rfl:    methocarbamol (ROBAXIN) 500  MG tablet, Take 1 tablet (500 mg total) by mouth every 8 (eight) hours as needed for muscle spasms., Disp: 60 tablet, Rfl: 0   nitrofurantoin, macrocrystal-monohydrate, (MACROBID) 100 MG capsule, Take 1 capsule (100 mg total) by mouth 2 (two) times daily., Disp: 10 capsule, Rfl: 0   progesterone (PROMETRIUM) 100 MG capsule, Take 100 mg by mouth daily. , Disp: , Rfl:    Ranitidine HCl (ZANTAC PO), Take by mouth., Disp: , Rfl:   EXAM:  VITALS per patient if applicable:  GENERAL: alert, oriented, appears well and in no acute distress  HEENT: atraumatic, conjunttiva clear, no obvious abnormalities on inspection of external nose and ears  NECK: normal movements of the head and neck  LUNGS: on inspection no signs of respiratory distress, breathing rate appears normal, no obvious gross SOB, gasping or wheezing  CV: no obvious cyanosis  MS: moves all visible extremities without noticeable abnormality  PSYCH/NEURO: pleasant and cooperative, no obvious depression or anxiety, speech and thought processing grossly intact  ASSESSMENT AND PLAN:  Discussed the following assessment and plan:  Dysuria.  Suspect uncomplicated cystitis.  -Macrobid 1 twice daily for 5 days -Plenty of fluids and follow-up for any persistent or worsening symptoms.     I discussed the assessment and treatment plan with the patient. The patient was provided an opportunity to ask questions and all were answered. The patient agreed with  the plan and demonstrated an understanding of the instructions.   The patient was advised to call back or seek an in-person evaluation if the symptoms worsen or if the condition fails to improve as anticipated.     Evelena Peat, MD

## 2020-08-03 ENCOUNTER — Other Ambulatory Visit: Payer: Self-pay

## 2020-08-03 ENCOUNTER — Ambulatory Visit: Payer: Medicare PPO | Admitting: Physical Therapy

## 2020-08-03 ENCOUNTER — Encounter: Payer: Self-pay | Admitting: Physical Therapy

## 2020-08-03 DIAGNOSIS — R2689 Other abnormalities of gait and mobility: Secondary | ICD-10-CM

## 2020-08-03 DIAGNOSIS — M25561 Pain in right knee: Secondary | ICD-10-CM

## 2020-08-03 NOTE — Therapy (Signed)
[facility] [place] [facility] [address] [place], [place], [address] Phone: [phone]   Fax:  [phone]  Physical Therapy Treatment  Patient Details  Name: Donna Shaw MRN: [id number] Date of Birth: 1955/12/04 Referring Provider (PT): Clementeen Graham   Encounter Date: 08/03/2020   PT End of Session - 08/03/20 1651     Visit Number 5    Number of Visits 12    Date for PT Re-Evaluation 08/24/20    Authorization Type [employer]    PT Start Time 1557    PT Stop Time 1645    PT Time Calculation (min) 48 min    Activity Tolerance Patient tolerated treatment well    Behavior During Therapy [facility] for tasks assessed/performed             Past Medical History:  Diagnosis Date   Arthritis    osteoarthritis neck   Chicken pox     Past Surgical History:  Procedure Laterality Date   MOUTH SURGERY     TUBAL LIGATION      There were no vitals filed for this visit.   Subjective Assessment - 08/03/20 1650     Subjective Pt states improving pain, gait, and  confidence with knee.    Currently in Pain? Yes    Pain Score 1     Pain Location Knee    Pain Orientation Right    Pain Descriptors / Indicators Aching    Pain Type Acute pain    Pain Onset More than a month ago    Pain Frequency Intermittent                               [facility] Adult PT Treatment/Exercise - 08/03/20 0001       Ambulation/Gait   Gait Comments 35 ft x 10 without AD education for heel strike , increasing knee flexion and extension,      Posture/Postural Control   Posture Comments mild fwd head and rounded shoulders,      Exercises   Exercises Knee/Hip      Knee/Hip Exercises: Stretches   Active Hamstring Stretch 3 reps;30 seconds    Active Hamstring Stretch Limitations seated      Knee/Hip Exercises: Aerobic   Recumbent Bike L 1 x 8  min      Knee/Hip Exercises: Standing   Forward Step Up 10 reps;Step Height: 6";Hand Hold: 0    Stairs  up/down 3 steps, 6 in ,no  HR x 4;    Other Standing Knee Exercises Tandem stance 30 sec x 3 bil; SLS, clock x 15 bil; Marching x 20;   AirEx: L/R and A/P weight shifts x 25 ea;  fwd and lateral step ups onto AirEx x 15 ea bil;      Knee/Hip Exercises: Seated   Long Arc Quad Both;20 reps    Long Arc Quad Weight 4 lbs.    Sit to [place] 10 reps;with UE support   light UE support from reg arm chair.     Knee/Hip Exercises: Supine   Bridges 20 reps    Straight Leg Raises Both;20 reps      Manual Therapy   Manual Therapy Taping;Joint mobilization;Soft tissue mobilization;Passive ROM    Passive ROM manual prone quad stretch for pain at end range of knee flexion      Kinesiotix   Create Space 2 I strips, for patella tendon decompression  PT Short Term Goals - 07/18/20 1016       PT SHORT TERM GOAL #1   Title Pt to be independent with initial HEP    Time 2    Period Weeks    Status New    Target Date 07/27/20      PT SHORT TERM GOAL #2   Title Pt to demo ability for ambulation without SPC for at least 100 ft    Time 3    Period Weeks    Status New    Target Date 08/03/20               PT Long Term Goals - 07/18/20 1016       PT LONG TERM GOAL #1   Title Pt to be independent with final HEP    Time 8    Period Weeks    Status New    Target Date 09/07/20      PT LONG TERM GOAL #2   Title Pt to report decreased pain in R knee to 0-/10 with activity and IADLS.    Time 8    Period Weeks    Status New    Target Date 09/07/20      PT LONG TERM GOAL #3   Title Pt to demo improved strength of R knee to at least 4+/5 to improve stability, gait, and stair ability    Time 8    Period Weeks    Status New    Target Date 09/07/20      PT LONG TERM GOAL #4   Title Pt to demo ability for recipricol stair climbing, with 1 HR, to improve ability for home and community navigation.    Time 8    Period Weeks    Status New    Target Date  09/07/20      PT LONG TERM GOAL #5   Title Pt to demo improved gait mechanics, to be WNL for pt age, without AD, for at least 500 ft, to improve ability for community activities and exercise.    Time 8    Period Weeks    Status New    Target Date 09/07/20                   Plan - 08/03/20 1652     Clinical Impression Statement Pt with improving pain levels. She also has improved ability for increased weight bearing and CKC exercises today. Challenged with unstable surface. Updated HEP to include more stability/balance. Pt progressing well.    Examination-Activity Limitations Stand;Locomotion Level;Stairs;Squat;Carry;Bend;Transfers    Examination-Participation Restrictions Cleaning;Meal Prep;Yard Work;Community Activity;Driving;Laundry;Shop    Stability/Clinical Decision Making Stable/Uncomplicated    Rehab Potential Good    PT Frequency 2x / week    PT Duration 6 weeks    PT Treatment/Interventions ADLs/Self Care Home Management;Cryotherapy;Psychologist, educational;Iontophoresis 4mg /ml Dexamethasone;Moist Heat;Ultrasound;Gait training;Stair training;Functional mobility training;Therapeutic activities;Therapeutic exercise;Balance training;Orthotic Fit/Training;Patient/family education;Neuromuscular re-education;Manual techniques;Passive range of motion;Dry needling;Energy conservation;Taping;Vasopneumatic Device;Joint Manipulations;Spinal Manipulations    PT Home Exercise Plan    Consulted and Agree with Plan of Care Patient             Patient will benefit from skilled therapeutic intervention in order to improve the following deficits and impairments:  Abnormal gait, Pain, Improper body mechanics, Decreased mobility, Increased muscle spasms  Visit Diagnosis: Acute pain of right knee  Other abnormalities of gait and mobility     Problem List Patient Active Problem List   Diagnosis Date  Noted   Osteoarthritis of neck 01/22/2012   Raynauds  phenomenon 01/22/2012    Sedalia Muta, PT, DPT 4:53 PM  08/03/20    [facility] [address] [place], [place], [address] Phone: [phone]   Fax:  [phone]  Name: Donna Shaw MRN: [id number] Date of Birth: 1955/03/16

## 2020-08-05 ENCOUNTER — Ambulatory Visit: Payer: Medicare PPO | Admitting: Physical Therapy

## 2020-08-05 ENCOUNTER — Other Ambulatory Visit: Payer: Self-pay

## 2020-08-05 DIAGNOSIS — M25561 Pain in right knee: Secondary | ICD-10-CM

## 2020-08-05 DIAGNOSIS — R2689 Other abnormalities of gait and mobility: Secondary | ICD-10-CM

## 2020-08-06 ENCOUNTER — Encounter: Payer: Self-pay | Admitting: Physical Therapy

## 2020-08-06 NOTE — Therapy (Signed)
[facility] [address] [place], [place], [address] Phone: [phone]   Fax:  [phone]  Physical Therapy Treatment  Patient Details  Name: Donna Shaw MRN: [id number] Date of Birth: [birth year] Referring Provider (PT): Clementeen Graham   Encounter Date: 08/05/2020   PT End of Session - 08/06/20 1050     Visit Number 6    Number of Visits 12    Date for PT Re-Evaluation 08/24/20    Authorization Type [employer]    PT Start Time 1517    PT Stop Time 1559    PT Time Calculation (min) 42 min    Activity Tolerance Patient tolerated treatment well    Behavior During Therapy [facility] for tasks assessed/performed             Past Medical History:  Diagnosis Date   Arthritis    osteoarthritis neck   Chicken pox     Past Surgical History:  Procedure Laterality Date   MOUTH SURGERY     TUBAL LIGATION      There were no vitals filed for this visit.   Subjective Assessment - 08/06/20 1049     Subjective Pt with increased soreness in knee today. Did walk alot around [facility] yesterday.    Currently in Pain? Yes    Pain Location Knee    Pain Orientation Right    Pain Descriptors / Indicators Aching    Pain Type Acute pain    Pain Onset More than a month ago    Pain Frequency Intermittent                               [facility] Adult PT Treatment/Exercise - 08/06/20 0001       Ambulation/Gait   Gait Comments 35 ft x 10 without AD education for heel strike , increasing knee flexion and extension,      Posture/Postural Control   Posture Comments mild fwd head and rounded shoulders,      Exercises   Exercises Knee/Hip      Knee/Hip Exercises: Stretches   Active Hamstring Stretch 3 reps;30 seconds    Active Hamstring Stretch Limitations seated    Other Knee/Hip Stretches SKTC for knee flexion ROM 30 sec x 3;      Knee/Hip Exercises: Aerobic   Recumbent Bike L 2 x 8  min      Knee/Hip Exercises: Standing    Heel Raises 20 reps    Heel Raises Limitations with post weight shift    Stairs up/down 3 steps, 6 in ,no  HR x 4;    Other Standing Knee Exercises Tandem stance 30 sec x 3 bil;  Marching x 20;    Other Standing Knee Exercises bwd walking 25 ft x 4;      Knee/Hip Exercises: Seated   Long Arc Quad Both;20 reps    Long Arc Quad Weight 3 lbs.      Knee/Hip Exercises: Supine   Bridges 20 reps      Knee/Hip Exercises: Sidelying   Hip ABduction 15 reps;Both      Manual Therapy   Manual Therapy Taping;Joint mobilization;Soft tissue mobilization;Passive ROM                      PT Short Term Goals - 07/18/20 1016       PT SHORT TERM GOAL #1   Title Pt to be independent with initial HEP  Time 2    Period Weeks    Status New    Target Date 07/27/20      PT SHORT TERM GOAL #2   Title Pt to demo ability for ambulation without SPC for at least 100 ft    Time 3    Period Weeks    Status New    Target Date 08/03/20               PT Long Term Goals - 07/18/20 1016       PT LONG TERM GOAL #1   Title Pt to be independent with final HEP    Time 8    Period Weeks    Status New    Target Date 09/07/20      PT LONG TERM GOAL #2   Title Pt to report decreased pain in R knee to 0-/10 with activity and IADLS.    Time 8    Period Weeks    Status New    Target Date 09/07/20      PT LONG TERM GOAL #3   Title Pt to demo improved strength of R knee to at least 4+/5 to improve stability, gait, and stair ability    Time 8    Period Weeks    Status New    Target Date 09/07/20      PT LONG TERM GOAL #4   Title Pt to demo ability for recipricol stair climbing, with 1 HR, to improve ability for home and community navigation.    Time 8    Period Weeks    Status New    Target Date 09/07/20      PT LONG TERM GOAL #5   Title Pt to demo improved gait mechanics, to be WNL for pt age, without AD, for at least 500 ft, to improve ability for community activities and  exercise.    Time 8    Period Weeks    Status New    Target Date 09/07/20                   Plan - 08/06/20 1053     Clinical Impression Statement Pt continues to do well with strengthening exercises. She is improving with pain overall, does have increased pain today, likely from increased walking yesterday. She does stil have diffiuclty with gait mechanics. Usually improved during sessions, but carryover limited at times. Today she has increased knee flexion in terminal stance(as she did previously), which may be pts compensation for pain, or may be what is causing pain with prolonged walking. Education and pratice for gait mechanics today with cuing for increased heel strike and TKe with initial contact. Pt to benefit from continued care.    Examination-Activity Limitations Stand;Locomotion Level;Stairs;Squat;Carry;Bend;Transfers    Examination-Participation Restrictions Cleaning;Meal Prep;Yard Work;Community Activity;Driving;Laundry;Shop    Stability/Clinical Decision Making Stable/Uncomplicated    Rehab Potential Good    PT Frequency 2x / week    PT Duration 6 weeks    PT Treatment/Interventions ADLs/Self Care Home Management;Cryotherapy;Psychologist, educational;Iontophoresis 4mg /ml Dexamethasone;Moist Heat;Ultrasound;Gait training;Stair training;Functional mobility training;Therapeutic activities;Therapeutic exercise;Balance training;Orthotic Fit/Training;Patient/family education;Neuromuscular re-education;Manual techniques;Passive range of motion;Dry needling;Energy conservation;Taping;Vasopneumatic Device;Joint Manipulations;Spinal Manipulations    PT Home Exercise Plan    Consulted and Agree with Plan of Care Patient             Patient will benefit from skilled therapeutic intervention in order to improve the following deficits and impairments:  Abnormal gait, Pain, Improper body mechanics, Decreased mobility,  Increased muscle spasms  Visit  Diagnosis: Acute pain of right knee  Other abnormalities of gait and mobility     Problem List Patient Active Problem List   Diagnosis Date Noted   Osteoarthritis of neck 01/22/2012   Raynauds phenomenon 01/22/2012    Sedalia Muta, PT, DPT 10:59 AM  08/06/20    [facility] [place] [facility] [address] [place], [place], [address] Phone: [phone]   Fax:  [phone]  Name: RHODA WALDVOGEL MRN: [id number] Date of Birth: [birth year]

## 2020-08-10 ENCOUNTER — Other Ambulatory Visit: Payer: Self-pay

## 2020-08-10 ENCOUNTER — Ambulatory Visit: Payer: Medicare PPO | Admitting: Physical Therapy

## 2020-08-10 DIAGNOSIS — R2689 Other abnormalities of gait and mobility: Secondary | ICD-10-CM

## 2020-08-10 DIAGNOSIS — M25561 Pain in right knee: Secondary | ICD-10-CM | POA: Diagnosis not present

## 2020-08-12 ENCOUNTER — Encounter: Payer: Self-pay | Admitting: Physical Therapy

## 2020-08-12 ENCOUNTER — Ambulatory Visit: Payer: Medicare PPO | Admitting: Physical Therapy

## 2020-08-12 ENCOUNTER — Other Ambulatory Visit: Payer: Self-pay

## 2020-08-12 DIAGNOSIS — M25561 Pain in right knee: Secondary | ICD-10-CM | POA: Diagnosis not present

## 2020-08-12 DIAGNOSIS — R2689 Other abnormalities of gait and mobility: Secondary | ICD-10-CM

## 2020-08-12 NOTE — Therapy (Addendum)
[name] [address] [place], [place], [address] Phone: [phone]   Fax:  [phone]  Physical Therapy Treatment  Patient Details  Name: Donna Shaw MRN: [id number] Date of Birth: [birth year] Referring Provider (PT): Lynne Leader   Encounter Date: 08/12/2020   PT End of Session - 08/12/20 1640     Visit Number 8    Number of Visits 12    Date for PT Re-Evaluation 08/24/20    Authorization Type [employer]    PT Start Time 1601    PT Stop Time 1640    PT Time Calculation (min) 39 min    Activity Tolerance Patient tolerated treatment well    Behavior During Therapy [facility] for tasks assessed/performed             Past Medical History:  Diagnosis Date   Arthritis    osteoarthritis neck   Chicken pox     Past Surgical History:  Procedure Laterality Date   MOUTH SURGERY     TUBAL LIGATION      There were no vitals filed for this visit.   Subjective Assessment - 08/12/20 1639     Subjective Pt feels she is doing well. Thinks she is walking better. no pain yesterday or today    Currently in Pain? No/denies    Pain Score 0-No pain                [facility] PT Assessment - 08/12/20 1646       Strength   Overall Strength Comments hips: 4+/5, knees: 5/5                           [facility] Adult PT Treatment/Exercise - 08/12/20 1646       Ambulation/Gait   Gait Comments 35 ft x 6, with practice for heel strik and push off.      Posture/Postural Control   Posture Comments mild fwd head and rounded shoulders,      Exercises   Exercises Knee/Hip      Knee/Hip Exercises: Aerobic   Tread Mill 1.3 mph, 4 min;    Recumbent Bike L 2 x 7  min      Knee/Hip Exercises: Standing   Stairs up/down 5 steps, 6 in ,no  HR x 4;    Other Standing Knee Exercises Tandem stance 30 sec x 1 bil; Tandem stance with head turns x 10 bil;  Marching x 20; SLS with clock x 10 bil;  HIp abd 2x10 bi;      Knee/Hip  Exercises: Supine   Bridges 20 reps    Bridges Limitations reviewed for HEP    Straight Leg Raises Both;20 reps    Straight Leg Raises Limitations reviewed for HEP      Manual Therapy   Manual Therapy Taping;Joint mobilization;Soft tissue mobilization;Passive ROM                    PT Education - 08/12/20 1640     Education Details Final HEP reviewed.    Person(s) Educated Patient    Methods Explanation;Demonstration;Verbal cues;Handout    Comprehension Verbalized understanding;Verbal cues required;Returned demonstration              PT Short Term Goals - 08/12/20 1641       PT SHORT TERM GOAL #1   Title Pt to be independent with initial HEP    Time 2    Period Weeks  Status Achieved    Target Date 07/27/20      PT SHORT TERM GOAL #2   Title Pt to demo ability for ambulation without SPC for at least 100 ft    Time 3    Period Weeks    Status Achieved    Target Date 08/03/20               PT Long Term Goals - 07/18/20 1016       PT LONG TERM GOAL #1   Title Pt to be independent with final HEP    Time 8    Period Weeks    Status New    Target Date 09/07/20      PT LONG TERM GOAL #2   Title Pt to report decreased pain in R knee to 0-/10 with activity and IADLS.    Time 8    Period Weeks    Status New    Target Date 09/07/20      PT LONG TERM GOAL #3   Title Pt to demo improved strength of R knee to at least 4+/5 to improve stability, gait, and stair ability    Time 8    Period Weeks    Status New    Target Date 09/07/20      PT LONG TERM GOAL #4   Title Pt to demo ability for recipricol stair climbing, with 1 HR, to improve ability for home and community navigation.    Time 8    Period Weeks    Status New    Target Date 09/07/20      PT LONG TERM GOAL #5   Title Pt to demo improved gait mechanics, to be WNL for pt age, without AD, for at least 500 ft, to improve ability for community activities and exercise.    Time 8     Period Weeks    Status New    Target Date 09/07/20                   Plan - 08/12/20 1642     Clinical Impression Statement Pt with much improved pain, no pain this week. Pt does have improved gait mechanics today from earlier this week, with minimal deficit. She is aware of her tendency for decreased heel strike and lack of TKE at times, with fatigue or increased pain. Pt thinks she is doing well at this time, and requests d/c to HEP. Final HEP reviewed today. Most Goals met at this time. Discussed importance of continuing HEP, and continuing to practice walking at home for optimal mechanics. Will hold for 2 weeks, pt will only return if she has increased difficulty or pain.    Examination-Activity Limitations Stand;Locomotion Level;Stairs;Squat;Carry;Bend;Transfers    Examination-Participation Restrictions Cleaning;Meal Prep;Yard Work;Community Activity;Driving;Laundry;Shop    Stability/Clinical Decision Making Stable/Uncomplicated    Rehab Potential Good    PT Frequency 2x / week    PT Duration 6 weeks    PT Treatment/Interventions ADLs/Self Care Home Management;Cryotherapy;Scientist, product/process development;Iontophoresis 63m/ml Dexamethasone;Moist Heat;Ultrasound;Gait training;Stair training;Functional mobility training;Therapeutic activities;Therapeutic exercise;Balance training;Orthotic Fit/Training;Patient/family education;Neuromuscular re-education;Manual techniques;Passive range of motion;Dry needling;Energy conservation;Taping;Vasopneumatic Device;Joint Manipulations;Spinal Manipulations    PT Home Exercise Plan [id number]   Consulted and Agree with Plan of Care Patient             Patient will benefit from skilled therapeutic intervention in order to improve the following deficits and impairments:  Abnormal gait, Pain, Improper body mechanics, Decreased mobility, Increased muscle spasms  Visit  Diagnosis: Acute pain of right knee  Other abnormalities of gait and  mobility     Problem List Patient Active Problem List   Diagnosis Date Noted   Osteoarthritis of neck 01/22/2012   Raynauds phenomenon 01/22/2012    [name], PT, DPT 4:48 PM  08/12/20    [facility] [place], [place], [address] Phone: [phone]   Fax:  [phone]  Name: Donna Shaw MRN: [id number] Date of Birth: 08/08/1955  PHYSICAL THERAPY DISCHARGE SUMMARY  Visits from Start of Care: 8 Plan: Patient agrees to discharge.  Patient goals were met. Patient is being discharged due to meeting the stated rehab goals.      [name], PT, DPT 12:09 PM  12/07/20

## 2020-08-12 NOTE — Therapy (Signed)
[facility] [address] [place], [place], [address] Phone: [phone]   Fax:  [phone]  Physical Therapy Treatment  Patient Details  Name: Donna Shaw MRN: [id number] Date of Birth: [birth year] Referring Provider (PT): Clementeen Graham   Encounter Date: 08/10/2020   PT End of Session - 08/12/20 1433     Visit Number 7    Number of Visits 12    Date for PT Re-Evaluation 08/24/20    Authorization Type [employer]    PT Start Time 1602    PT Stop Time 1640    PT Time Calculation (min) 38 min    Activity Tolerance Patient tolerated treatment well    Behavior During Therapy [facility] for tasks assessed/performed             Past Medical History:  Diagnosis Date   Arthritis    osteoarthritis neck   Chicken pox     Past Surgical History:  Procedure Laterality Date   MOUTH SURGERY     TUBAL LIGATION      There were no vitals filed for this visit.   Subjective Assessment - 08/12/20 1431     Subjective Pt states mild soreness in knee. Having soreness after she is up, walking for longer periods. States pain much less overall than it was at [facility]. She admits to still "not walking right". Pt was previously walking on treadmill for exercise, and would like to get back to doing that.    Currently in Pain? Yes    Pain Score 1     Pain Location Knee    Pain Orientation Right    Pain Descriptors / Indicators Aching    Pain Type Acute pain    Pain Onset More than a month ago    Pain Frequency Intermittent                               [facility] Adult PT Treatment/Exercise - 08/12/20 0001       Ambulation/Gait   Gait Comments 35 ft x 10 without shoes, with practice for heel strik and push off.      Posture/Postural Control   Posture Comments mild fwd head and rounded shoulders,      Exercises   Exercises Knee/Hip      Knee/Hip Exercises: Stretches   Other Knee/Hip Stretches SKTC for knee flexion ROM 30 sec x 3;       Knee/Hip Exercises: Aerobic   Tread Mill 1.1 mph, 4 min;    Recumbent Bike L 2 x 8  min      Knee/Hip Exercises: Standing   Heel Raises 20 reps    Stairs up/down 5 steps, 6 in ,no  HR x 4;    Other Standing Knee Exercises Tandem stance 30 sec x 3 bil;  Marching x 20; SLS with clock x 10 bil;  HIp abd 2x10 bi;    Other Standing Knee Exercises bwd walking 25 ft x 4;      Knee/Hip Exercises: Seated   Long Arc Quad Both;20 reps    Long Arc Quad Weight 3 lbs.      Knee/Hip Exercises: Supine   Bridges 20 reps    Straight Leg Raises Both;20 reps      Manual Therapy   Manual Therapy Taping;Joint mobilization;Soft tissue mobilization;Passive ROM  PT Short Term Goals - 07/18/20 1016       PT SHORT TERM GOAL #1   Title Pt to be independent with initial HEP    Time 2    Period Weeks    Status New    Target Date 07/27/20      PT SHORT TERM GOAL #2   Title Pt to demo ability for ambulation without SPC for at least 100 ft    Time 3    Period Weeks    Status New    Target Date 08/03/20               PT Long Term Goals - 07/18/20 1016       PT LONG TERM GOAL #1   Title Pt to be independent with final HEP    Time 8    Period Weeks    Status New    Target Date 09/07/20      PT LONG TERM GOAL #2   Title Pt to report decreased pain in R knee to 0-/10 with activity and IADLS.    Time 8    Period Weeks    Status New    Target Date 09/07/20      PT LONG TERM GOAL #3   Title Pt to demo improved strength of R knee to at least 4+/5 to improve stability, gait, and stair ability    Time 8    Period Weeks    Status New    Target Date 09/07/20      PT LONG TERM GOAL #4   Title Pt to demo ability for recipricol stair climbing, with 1 HR, to improve ability for home and community navigation.    Time 8    Period Weeks    Status New    Target Date 09/07/20      PT LONG TERM GOAL #5   Title Pt to demo improved gait mechanics, to be WNL  for pt age, without AD, for at least 500 ft, to improve ability for community activities and exercise.    Time 8    Period Weeks    Status New    Target Date 09/07/20                   Plan - 08/12/20 1434     Clinical Impression Statement Pt progressing well with strength and stability exercises. Pain also much improved overall. She still does have some difficulty with walking and gait mechanics, that have not returned to her baseline yet. She was able to safely walk on treadmill for short distance today, discussed safety with this for performing at home. Pt requires cueing with ambulation (on floor) for increasing heel strike, knee extension, and ankle mobility. She has tendency for stiff foot, with mid foot contact, causing increased knee flexion, and possibly leading to increased pain in anterior knee. Pt with improved mechanics after cueing and practice at each visit.    Examination-Activity Limitations Stand;Locomotion Level;Stairs;Squat;Carry;Bend;Transfers    Examination-Participation Restrictions Cleaning;Meal Prep;Yard Work;Community Activity;Driving;Laundry;Shop    Stability/Clinical Decision Making Stable/Uncomplicated    Rehab Potential Good    PT Frequency 2x / week    PT Duration 6 weeks    PT Treatment/Interventions ADLs/Self Care Home Management;Cryotherapy;Psychologist, educational;Iontophoresis 4mg /ml Dexamethasone;Moist Heat;Ultrasound;Gait training;Stair training;Functional mobility training;Therapeutic activities;Therapeutic exercise;Balance training;Orthotic Fit/Training;Patient/family education;Neuromuscular re-education;Manual techniques;Passive range of motion;Dry needling;Energy conservation;Taping;Vasopneumatic Device;Joint Manipulations;Spinal Manipulations    PT Home Exercise Plan    Consulted and Agree with Plan of  Care Patient             Patient will benefit from skilled therapeutic intervention in order to improve the  following deficits and impairments:  Abnormal gait, Pain, Improper body mechanics, Decreased mobility, Increased muscle spasms  Visit Diagnosis: Acute pain of right knee  Other abnormalities of gait and mobility     Problem List Patient Active Problem List   Diagnosis Date Noted   Osteoarthritis of neck 01/22/2012   Raynauds phenomenon 01/22/2012    Sedalia Muta, PT, DPT 2:37 PM  08/12/20    [facility] [address] [place], [place], [address] Phone: [phone]   Fax:  [phone]  Name: Donna Shaw MRN: [id number] Date of Birth: August 24, 1955

## 2020-09-15 ENCOUNTER — Telehealth (INDEPENDENT_AMBULATORY_CARE_PROVIDER_SITE_OTHER): Payer: Medicare PPO | Admitting: Family Medicine

## 2020-09-15 ENCOUNTER — Other Ambulatory Visit: Payer: Self-pay

## 2020-09-15 DIAGNOSIS — U071 COVID-19: Secondary | ICD-10-CM | POA: Diagnosis not present

## 2020-09-15 MED ORDER — NIRMATRELVIR/RITONAVIR (PAXLOVID)TABLET: 3.0000 | ORAL_TABLET | Freq: Two times a day (BID) | ORAL | 0 refills | Status: AC

## 2020-09-15 NOTE — Progress Notes (Signed)
Patient ID: Donna Shaw, female   DOB: 08-24-1955, 65 y.o.   MRN: [id number]

## 2020-09-15 NOTE — Progress Notes (Signed)
Patient ID: Donna Shaw, female   DOB: Feb 23, 1955, 65 y.o.   MRN: [id number]  This visit type was conducted due to national recommendations for restrictions regarding the COVID-19 pandemic in an effort to limit this patient's exposure and mitigate transmission in our community.   Virtual Visit via Video Note  I connected with Makayela Secrest on 09/15/20 at 10:00 AM EDT by a video enabled telemedicine application and verified that I am speaking with the correct person using two identifiers.  Location patient: home Location provider:work or home office Persons participating in the virtual visit: patient, provider  I discussed the limitations of evaluation and management by telemedicine and the availability of in person appointments. The patient expressed understanding and agreed to proceed.   HPI:  Karmyn tested positive for COVID last night.  Her husband just tested positive yesterday.  Her husband travels with work and they think he picked up COVID on recent travel to [place].  Mad[place]x developed cough, headache, left earache, intermittent chills and malaise.  No dyspnea.  She does not have a thermometer to check temperature.  She has had Pfizer COVID-vaccine with booster.  She is age 34 but no other major comorbidities.  She specifically has interest in antiviral therapy options.  She has history of normal renal function.  Does not take any regular medications.   ROS: See pertinent positives and negatives per HPI.  Past Medical History:  Diagnosis Date   Arthritis    osteoarthritis neck   Chicken pox     Past Surgical History:  Procedure Laterality Date   MOUTH SURGERY     TUBAL LIGATION      Family History  Problem Relation Age of Onset   Cancer Mother        lung   Cancer Father        colon   Diabetes Brother     SOCIAL HX: Non-smoker   Current Outpatient Medications:    nirmatrelvir/ritonavir EUA (PAXLOVID) 20 x 150 MG & 10 x 100MG  TABS, Take 3 tablets by mouth 2  (two) times daily for 5 days. >60(Take nirmatrelvir 150 mg two tablets twice daily for 5 days and ritonavir 100 mg one tablet twice daily for 5 days) Patient GFR is >60, Disp: 30 tablet, Rfl: 0   progesterone (PROMETRIUM) 100 MG capsule, Take 100 mg by mouth daily. , Disp: , Rfl:    Ranitidine HCl (ZANTAC PO), Take by mouth., Disp: , Rfl:   EXAM:  VITALS per patient if applicable:  GENERAL: alert, oriented, appears well and in no acute distress  HEENT: atraumatic, conjunttiva clear, no obvious abnormalities on inspection of external nose and ears  NECK: normal movements of the head and neck  LUNGS: on inspection no signs of respiratory distress, breathing rate appears normal, no obvious gross SOB, gasping or wheezing  CV: no obvious cyanosis  MS: moves all visible extremities without noticeable abnormality  PSYCH/NEURO: pleasant and cooperative, no obvious depression or anxiety, speech and thought processing grossly intact  ASSESSMENT AND PLAN:  Discussed the following assessment and plan:  COVID-19 onset yesterday of COVID-19 infection.  She is in no distress overall at this time.  We discussed antiviral therapies.  She does express interest in starting that.  We will start Paxlovid 3 capsules twice daily for 5 days.  She does not have any known contraindications.  Does not take any regular medications and has history of normal renal function     I discussed the assessment and treatment  plan with the patient. The patient was provided an opportunity to ask questions and all were answered. The patient agreed with the plan and demonstrated an understanding of the instructions.   The patient was advised to call back or seek an in-person evaluation if the symptoms worsen or if the condition fails to improve as anticipated.     Evelena Peat, MD

## 2020-10-12 ENCOUNTER — Ambulatory Visit: Payer: Medicare PPO | Admitting: Family Medicine

## 2020-10-12 ENCOUNTER — Other Ambulatory Visit: Payer: Self-pay

## 2020-10-12 VITALS — BP 132/70 | HR 100 | Temp 97.8°F | Wt 176.9 lb

## 2020-10-12 DIAGNOSIS — K219 Gastro-esophageal reflux disease without esophagitis: Secondary | ICD-10-CM

## 2020-10-12 DIAGNOSIS — R053 Chronic cough: Secondary | ICD-10-CM

## 2020-10-12 DIAGNOSIS — Z23 Encounter for immunization: Secondary | ICD-10-CM | POA: Diagnosis not present

## 2020-10-12 MED ORDER — ALBUTEROL SULFATE HFA 108 (90 BASE) MCG/ACT IN AERS: 2.0000 | INHALATION_SPRAY | Freq: Four times a day (QID) | RESPIRATORY_TRACT | 1 refills | Status: DC | PRN

## 2020-10-12 NOTE — Progress Notes (Signed)
Established Patient Office Visit  Subjective:  Patient ID: Donna Shaw, female    DOB: [birth year]  Age: 65 y.o. MRN: [id number]  CC:  Chief Complaint  Patient presents with   Cough    Lingering symptoms from covid, cough, sob, fatigue     Donna Shaw presents for chronic cough and fatigue issues.  Donna had COVID infection was diagnosed with positive test September 7.  Donna was fairly ill and Donna and her husband both had this and Donna feels like Donna has some improved but is had lingering fatigue.  Donna states though Donna had cough even prior to this going back to July.  Denies any hemoptysis.  No appetite or significant weight changes.  Non-smoker.  Donna Shaw and occasional "croupy "cough.  No history of asthma.  No pleuritic pain.  Does have frequent GERD symptoms.  No active postnasal drip symptoms.  Donna is taken some over-the-counter Mucinex without much improvement.  Donna does request flu vaccine and also inquires about pneumonia vaccine.  Donna turns 65 in July.  Her mother had lung cancer and father had colon cancer.  Past Medical History:  Diagnosis Date   Arthritis    osteoarthritis neck   Chicken pox     Past Surgical History:  Procedure Laterality Date   MOUTH SURGERY     TUBAL LIGATION      Family History  Problem Relation Age of Onset   Cancer Mother        lung   Cancer Father        colon   Diabetes Brother     Social History   Socioeconomic History   Marital status: Married    Spouse name: Not on file   Number of children: Not on file   Years of education: Not on file   Highest education level: Not on file  Occupational History   Not on file  Tobacco Use   Smoking status: Never   Smokeless tobacco: Never  Vaping Use   Vaping Use: Never used  Substance and Sexual Activity   Alcohol use: Yes    Alcohol/week: 1.0 standard drink    Types: 1 Glasses of wine per week    Comment: Occasionally    Drug use: Never   Sexual activity: Not on file  Other Topics Concern   Not on file  Social History Narrative   Not on file   Social Determinants of Health   Financial Resource Strain: Not on file  Food Insecurity: Not on file  Transportation Needs: Not on file  Physical Activity: Not on file  Stress: Not on file  Social Connections: Not on file  Intimate Partner Violence: Not on file    Outpatient Medications Prior to Visit  Medication Sig Dispense Refill   progesterone (PROMETRIUM) 100 MG capsule Take 100 mg by mouth daily.      Ranitidine HCl (ZANTAC PO) Take by mouth.     No facility-administered medications prior to visit.    Allergies  Allergen Reactions   Codeine     hives   Sulfamethoxazole-Tmp Ds [Sulfamethoxazole-Trimethoprim]     Pt is able to take medication however, Donna would prefer NOT to due to medication making her feel nauseated.    ROS Review of Systems  Constitutional:  Positive for fatigue. Negative for appetite change, chills, fever and unexpected weight change.  Respiratory:  Positive for cough and wheezing.   Cardiovascular:  Negative for chest pain, palpitations and leg swelling.  Neurological:  Negative for dizziness.  Hematological:  Negative for adenopathy.     Objective:    Physical Exam Vitals reviewed.  Constitutional:      Appearance: Normal appearance.  Cardiovascular:     Rate and Rhythm: Normal rate and regular rhythm.  Pulmonary:     Effort: Pulmonary effort is normal.     Comments: Donna had a few faint rales left base of the initial exam but after several deep breaths this cleared.  No active wheezing at this time.  No respiratory distress.  Pulse oximetry 96% Musculoskeletal:     Cervical back: Neck supple.  Lymphadenopathy:     Cervical: No cervical adenopathy.  Neurological:     Mental Status: Donna is alert.    BP 132/70 (BP Location: Left Arm, Patient Position: Sitting, Cuff Size: Normal)   Pulse 100   Temp 97.8 F  (36.6 C) (Oral)   Wt 176 lb 14.4 oz (80.2 kg)   LMP 06/16/2010   SpO2 96%   BMI 31.34 kg/m  Wt Readings from Last 3 Encounters:  10/12/20 176 lb 14.4 oz (80.2 kg)  07/05/20 186 lb 6.4 oz (84.6 kg)  06/23/20 186 lb 12.8 oz (84.7 kg)     Health Maintenance Due  Topic Date Due   HIV Screening  Never done   COVID-19 Vaccine (4 - Booster for Pfizer series) 02/14/2020   DEXA SCAN  Never done    There are no preventive care reminders to display for this patient.  Lab Results  Component Value Date   TSH 2.82 08/12/2019   Lab Results  Component Value Date   WBC 5.2 08/12/2019   HGB 13.5 08/12/2019   HCT 40.6 08/12/2019   MCV 94.0 08/12/2019   PLT 407 (H) 08/12/2019   Lab Results  Component Value Date   NA 141 08/12/2019   K 4.3 08/12/2019   CO2 26 08/12/2019   GLUCOSE 90 08/12/2019   BUN 17 08/12/2019   CREATININE 0.91 08/12/2019   BILITOT 0.4 08/12/2019   ALKPHOS 64 10/30/2017   AST 18 08/12/2019   ALT 12 08/12/2019   PROT 6.6 08/12/2019   ALBUMIN 4.2 10/30/2017   CALCIUM 9.8 08/12/2019   GFR 70.07 10/30/2017   Lab Results  Component Value Date   CHOL 207 (H) 08/12/2019   Lab Results  Component Value Date   HDL 49 (L) 08/12/2019   Lab Results  Component Value Date   LDLCALC 134 (H) 08/12/2019   Lab Results  Component Value Date   TRIG 129 08/12/2019   Lab Results  Component Value Date   CHOLHDL 4.2 08/12/2019   No results found for: HGBA1C    Assessment & Plan:   Problem List Items Addressed This Visit   None Visit Diagnoses     Chronic cough    -  Primary   Relevant Orders   DG Chest 2 View   Need for immunization against influenza       Relevant Orders   Flu Vaccine QUAD High Dose(Fluad) (Completed)   Need for pneumococcal vaccination       Relevant Orders   Pneumococcal conjugate vaccine 20-valent (Prevnar 20) (Completed)   Gastroesophageal reflux disease, unspecified whether esophagitis present         Patient had recent COVID  infection back in early September and feels mostly recovered from that.  Donna describes a chronic cough going back to July.  Does not have  any red flags such as significant weight loss, hemoptysis, persistent fever, etc.  -Given duration of cough go ahead with chest x-ray -Flu vaccine and Prevnar 20 given -ProAir multidose inhaler 2 puffs every 4-6 hours as needed -We recommended Donna avoid eating within 2 to 3 hours of bedtime.  Discussed reflux precautions.  Consider elevate head of bed 4 to 6 inches.  Also recommend consider trial of over-the-counter Prilosec or Nexium 20 mg daily  Meds ordered this encounter  Medications   albuterol (PROAIR HFA) 108 (90 Base) MCG/ACT inhaler    Sig: Inhale 2 puffs into the lungs every 6 (six) hours as needed for wheezing or shortness of breath.    Dispense:  8 g    Refill:  1    Follow-up: No follow-ups on file.    Evelena Peat, MD

## 2020-10-12 NOTE — Patient Instructions (Signed)
Consider OTC Prilosec or Nexium 20 mg once daily  Avoid eating within 2-3 hours   Consider elevate head of bed 4-6 inches.

## 2020-10-14 ENCOUNTER — Other Ambulatory Visit: Payer: Medicare PPO

## 2020-10-14 ENCOUNTER — Ambulatory Visit (INDEPENDENT_AMBULATORY_CARE_PROVIDER_SITE_OTHER): Payer: Medicare PPO

## 2020-10-14 ENCOUNTER — Other Ambulatory Visit: Payer: Self-pay

## 2020-10-14 DIAGNOSIS — R053 Chronic cough: Secondary | ICD-10-CM

## 2020-10-14 DIAGNOSIS — R059 Cough, unspecified: Secondary | ICD-10-CM | POA: Diagnosis not present

## 2020-10-15 ENCOUNTER — Other Ambulatory Visit: Payer: Medicare PPO

## 2020-10-25 ENCOUNTER — Ambulatory Visit
Admission: RE | Admit: 2020-10-25 | Discharge: 2020-10-25 | Disposition: A | Discharge status: Home / Self Care | Payer: Medicare PPO | Source: Ambulatory Visit | Attending: Obstetrics | Admitting: Obstetrics

## 2020-10-25 ENCOUNTER — Other Ambulatory Visit: Payer: Self-pay | Admitting: Obstetrics

## 2020-10-25 DIAGNOSIS — R921 Mammographic calcification found on diagnostic imaging of breast: Secondary | ICD-10-CM | POA: Diagnosis not present

## 2020-10-25 DIAGNOSIS — R922 Inconclusive mammogram: Secondary | ICD-10-CM | POA: Diagnosis not present

## 2020-11-05 DIAGNOSIS — Z01411 Encounter for gynecological examination (general) (routine) with abnormal findings: Secondary | ICD-10-CM | POA: Diagnosis not present

## 2020-11-05 DIAGNOSIS — Z124 Encounter for screening for malignant neoplasm of cervix: Secondary | ICD-10-CM | POA: Diagnosis not present

## 2020-11-05 DIAGNOSIS — Z1212 Encounter for screening for malignant neoplasm of rectum: Secondary | ICD-10-CM | POA: Diagnosis not present

## 2020-11-05 DIAGNOSIS — N951 Menopausal and female climacteric states: Secondary | ICD-10-CM | POA: Diagnosis not present

## 2020-11-05 DIAGNOSIS — Z6831 Body mass index (BMI) 31.0-31.9, adult: Secondary | ICD-10-CM | POA: Diagnosis not present

## 2020-11-05 DIAGNOSIS — Z1211 Encounter for screening for malignant neoplasm of colon: Secondary | ICD-10-CM | POA: Diagnosis not present

## 2020-11-05 DIAGNOSIS — Z01419 Encounter for gynecological examination (general) (routine) without abnormal findings: Secondary | ICD-10-CM | POA: Diagnosis not present

## 2020-11-05 DIAGNOSIS — Z13820 Encounter for screening for osteoporosis: Secondary | ICD-10-CM | POA: Diagnosis not present

## 2020-11-12 ENCOUNTER — Other Ambulatory Visit: Payer: Self-pay | Admitting: Obstetrics

## 2020-11-12 DIAGNOSIS — Z13820 Encounter for screening for osteoporosis: Secondary | ICD-10-CM

## 2020-11-22 DIAGNOSIS — Z1212 Encounter for screening for malignant neoplasm of rectum: Secondary | ICD-10-CM | POA: Diagnosis not present

## 2020-11-22 DIAGNOSIS — Z1211 Encounter for screening for malignant neoplasm of colon: Secondary | ICD-10-CM | POA: Diagnosis not present

## 2020-11-25 ENCOUNTER — Ambulatory Visit
Admission: RE | Admit: 2020-11-25 | Discharge: 2020-11-25 | Disposition: A | Discharge status: Home / Self Care | Payer: Medicare PPO | Source: Ambulatory Visit | Attending: Obstetrics | Admitting: Obstetrics

## 2020-11-25 DIAGNOSIS — Z780 Asymptomatic menopausal state: Secondary | ICD-10-CM | POA: Diagnosis not present

## 2020-11-25 DIAGNOSIS — M85852 Other specified disorders of bone density and structure, left thigh: Secondary | ICD-10-CM | POA: Diagnosis not present

## 2020-11-25 DIAGNOSIS — Z13820 Encounter for screening for osteoporosis: Secondary | ICD-10-CM

## 2020-11-26 LAB — COLOGUARD: COLOGUARD: POSITIVE — AB

## 2020-12-02 ENCOUNTER — Other Ambulatory Visit: Payer: Self-pay | Admitting: Family Medicine

## 2020-12-23 DIAGNOSIS — D128 Benign neoplasm of rectum: Secondary | ICD-10-CM | POA: Diagnosis not present

## 2020-12-23 DIAGNOSIS — Z800 Family history of malignant neoplasm of digestive organs: Secondary | ICD-10-CM | POA: Diagnosis not present

## 2020-12-23 DIAGNOSIS — R195 Other fecal abnormalities: Secondary | ICD-10-CM | POA: Diagnosis not present

## 2020-12-23 DIAGNOSIS — K635 Polyp of colon: Secondary | ICD-10-CM | POA: Diagnosis not present

## 2020-12-23 DIAGNOSIS — K621 Rectal polyp: Secondary | ICD-10-CM | POA: Diagnosis not present

## 2020-12-23 DIAGNOSIS — K648 Other hemorrhoids: Secondary | ICD-10-CM | POA: Diagnosis not present

## 2021-02-08 ENCOUNTER — Telehealth: Payer: Self-pay

## 2021-02-08 ENCOUNTER — Ambulatory Visit (INDEPENDENT_AMBULATORY_CARE_PROVIDER_SITE_OTHER): Payer: Medicare PPO | Admitting: Family Medicine

## 2021-02-08 ENCOUNTER — Encounter: Payer: Self-pay | Admitting: Family Medicine

## 2021-02-08 VITALS — BP 120/70 | HR 76 | Temp 97.7°F | Wt 169.2 lb

## 2021-02-08 DIAGNOSIS — Z0000 Encounter for general adult medical examination without abnormal findings: Secondary | ICD-10-CM

## 2021-02-08 DIAGNOSIS — Z23 Encounter for immunization: Secondary | ICD-10-CM | POA: Diagnosis not present

## 2021-02-08 LAB — HEPATIC FUNCTION PANEL
ALT: 12 U/L (ref 0–35)
AST: 20 U/L (ref 0–37)
Albumin: 4.2 g/dL (ref 3.5–5.2)
Alkaline Phosphatase: 71 U/L (ref 39–117)
Bilirubin, Direct: 0 mg/dL (ref 0.0–0.3)
Total Bilirubin: 0.4 mg/dL (ref 0.2–1.2)
Total Protein: 7.2 g/dL (ref 6.0–8.3)

## 2021-02-08 LAB — LIPID PANEL
Cholesterol: 192 mg/dL (ref 0–200)
HDL: 51 mg/dL (ref >39.00)
LDL Cholesterol: 120 mg/dL — ABNORMAL HIGH (ref 0–99)
NonHDL: 140.89
Total CHOL/HDL Ratio: 4
Triglycerides: 104 mg/dL (ref 0.0–149.0)
VLDL: 20.8 mg/dL (ref 0.0–40.0)

## 2021-02-08 LAB — CBC WITH DIFFERENTIAL/PLATELET
Basophils Absolute: 0.1 10*3/uL (ref 0.0–0.1)
Basophils Relative: 2.2 % (ref 0.0–3.0)
Eosinophils Absolute: 0.1 10*3/uL (ref 0.0–0.7)
Eosinophils Relative: 1.9 % (ref 0.0–5.0)
HCT: 43 % (ref 36.0–46.0)
Hemoglobin: 13.9 g/dL (ref 12.0–15.0)
Lymphocytes Relative: 27.6 % (ref 12.0–46.0)
Lymphs Abs: 1.7 10*3/uL (ref 0.7–4.0)
MCHC: 32.2 g/dL (ref 30.0–36.0)
MCV: 94.2 fl (ref 78.0–100.0)
Monocytes Absolute: 0.6 10*3/uL (ref 0.1–1.0)
Monocytes Relative: 9.6 % (ref 3.0–12.0)
Neutro Abs: 3.6 10*3/uL (ref 1.4–7.7)
Neutrophils Relative %: 58.7 % (ref 43.0–77.0)
Platelets: 416 10*3/uL — ABNORMAL HIGH (ref 150.0–400.0)
RBC: 4.57 Mil/uL (ref 3.87–5.11)
RDW: 13 % (ref 11.5–15.5)
WBC: 6.1 10*3/uL (ref 4.0–10.5)

## 2021-02-08 LAB — TSH: TSH: 2.89 u[IU]/mL (ref 0.35–5.50)

## 2021-02-08 LAB — BASIC METABOLIC PANEL
BUN: 16 mg/dL (ref 6–23)
CO2: 28 mEq/L (ref 19–32)
Calcium: 9.2 mg/dL (ref 8.4–10.5)
Chloride: 107 mEq/L (ref 96–112)
Creatinine, Ser: 0.91 mg/dL (ref 0.40–1.20)
GFR: 66.2 mL/min (ref >60.00)
Glucose, Bld: 91 mg/dL (ref 70–99)
Potassium: 4 mEq/L (ref 3.5–5.1)
Sodium: 140 mEq/L (ref 135–145)

## 2021-02-08 NOTE — Progress Notes (Signed)
Established Patient Office Visit  Subjective:  Patient ID: Donna Shaw, female    DOB: Feb 27, 1955  Age: 65 y.o. MRN: [id number]  CC:  Chief Complaint  Patient presents with   Annual Exam    HPI Donna Shaw presents for annual physical exam.  Generally very healthy.  She sees gynecologist regularly.  She takes no regular medications.  Of maintenance reviewed:  -Last tetanus was 10 years ago.  She is very concerned because they have 30-month-old grandchild.  She did apparently get Tdap 10 years ago. -Influenza vaccine already given -Up-to-date regarding COVID vaccines -Had DEXA scan in November -Had Prevnar 2010/22 -Has had Shingrix vaccine-prior hepatitis C screening negative -Colonoscopy due 2027 -Scheduled for repeat mammogram 4/23 -Pap smear up-to-date  Family history-mother had lung cancer age 60.  She was a heavy smoker.  Father had some type of gastrointestinal tumor and she is not sure which type.  Social history-she is married.  Never smoked.  No regular alcohol.   Past Medical History:  Diagnosis Date   Arthritis    osteoarthritis neck   Chicken pox     Past Surgical History:  Procedure Laterality Date   MOUTH SURGERY     TUBAL LIGATION      Family History  Problem Relation Age of Onset   Cancer Mother        lung   Cancer Father        colon   Alcohol abuse Brother    Diabetes Brother     Social History   Socioeconomic History   Marital status: Married    Spouse name: Not on file   Number of children: Not on file   Years of education: Not on file   Highest education level: Not on file  Occupational History   Not on file  Tobacco Use   Smoking status: Never   Smokeless tobacco: Never  Vaping Use   Vaping Use: Never used  Substance and Sexual Activity   Alcohol use: Yes    Alcohol/week: 1.0 standard drink    Types: 1 Glasses of wine per week    Comment: Occasionally   Drug use: Never   Sexual activity: Not on file  Other Topics  Concern   Not on file  Social History Narrative   Not on file   Social Determinants of Health   Financial Resource Strain: Not on file  Food Insecurity: Not on file  Transportation Needs: Not on file  Physical Activity: Not on file  Stress: Not on file  Social Connections: Not on file  Intimate Partner Violence: Not on file    Outpatient Medications Prior to Visit  Medication Sig Dispense Refill   albuterol (VENTOLIN HFA) 108 (90 Base) MCG/ACT inhaler TAKE 2 PUFFS BY MOUTH EVERY 6 HOURS AS NEEDED FOR WHEEZE OR SHORTNESS OF BREATH 8.5 each 1   progesterone (PROMETRIUM) 100 MG capsule Take 100 mg by mouth daily.      Ranitidine HCl (ZANTAC PO) Take by mouth.     No facility-administered medications prior to visit.    Allergies  Allergen Reactions   Codeine     hives   Sulfamethoxazole-Tmp Ds [Sulfamethoxazole-Trimethoprim]     Pt is able to take medication however, she would prefer NOT to due to medication making her feel nauseated.    ROS Review of Systems  Constitutional:  Negative for activity change, appetite change, fatigue, fever and unexpected weight change.  HENT:  Negative for ear pain, hearing loss,  sore throat and trouble swallowing.   Eyes:  Negative for visual disturbance.  Respiratory:  Negative for cough and shortness of breath.   Cardiovascular:  Negative for chest pain and palpitations.  Gastrointestinal:  Negative for abdominal pain, blood in stool, constipation and diarrhea.  Endocrine: Negative for polydipsia and polyuria.  Genitourinary:  Negative for dysuria and hematuria.  Musculoskeletal:  Negative for arthralgias, back pain and myalgias.  Skin:  Negative for rash.  Neurological:  Negative for dizziness, syncope and headaches.  Hematological:  Negative for adenopathy.  Psychiatric/Behavioral:  Negative for confusion and dysphoric mood.      Objective:    Physical Exam Vitals reviewed.  Constitutional:      Appearance: Normal appearance. She  is well-developed.  HENT:     Head: Normocephalic and atraumatic.  Eyes:     Pupils: Pupils are equal, round, and reactive to light.  Neck:     Thyroid: No thyromegaly.  Cardiovascular:     Rate and Rhythm: Normal rate and regular rhythm.     Heart sounds: Normal heart sounds.  Pulmonary:     Effort: No respiratory distress.     Breath sounds: Normal breath sounds. No wheezing or rales.  Abdominal:     General: Bowel sounds are normal. There is no distension.     Palpations: Abdomen is soft. There is no mass.     Tenderness: There is no abdominal tenderness. There is no guarding or rebound.  Musculoskeletal:        General: Normal range of motion.     Cervical back: Normal range of motion and neck supple.  Lymphadenopathy:     Cervical: No cervical adenopathy.  Skin:    Findings: No rash.  Neurological:     Mental Status: She is alert and oriented to person, place, and time.     Cranial Nerves: No cranial nerve deficit.  Psychiatric:        Behavior: Behavior normal.        Thought Content: Thought content normal.        Judgment: Judgment normal.    BP 120/70 (BP Location: Left Arm, Patient Position: Sitting, Cuff Size: Normal)    Pulse 76    Temp 97.7 F (36.5 C) (Oral)    Wt 169 lb 3.2 oz (76.7 kg)    LMP 06/16/2010    SpO2 99%    BMI 29.97 kg/m  Wt Readings from Last 3 Encounters:  02/08/21 169 lb 3.2 oz (76.7 kg)  10/12/20 176 lb 14.4 oz (80.2 kg)  07/05/20 186 lb 6.4 oz (84.6 kg)     Health Maintenance Due  Topic Date Due   HIV Screening  Never done    There are no preventive care reminders to display for this patient.  Lab Results  Component Value Date   TSH 2.82 08/12/2019   Lab Results  Component Value Date   WBC 5.2 08/12/2019   HGB 13.5 08/12/2019   HCT 40.6 08/12/2019   MCV 94.0 08/12/2019   PLT 407 (H) 08/12/2019   Lab Results  Component Value Date   NA 141 08/12/2019   K 4.3 08/12/2019   CO2 26 08/12/2019   GLUCOSE 90 08/12/2019   BUN  17 08/12/2019   CREATININE 0.91 08/12/2019   BILITOT 0.4 08/12/2019   ALKPHOS 64 10/30/2017   AST 18 08/12/2019   ALT 12 08/12/2019   PROT 6.6 08/12/2019   ALBUMIN 4.2 10/30/2017   CALCIUM 9.8 08/12/2019   GFR 70.07  10/30/2017   Lab Results  Component Value Date   CHOL 207 (H) 08/12/2019   Lab Results  Component Value Date   HDL 49 (L) 08/12/2019   Lab Results  Component Value Date   LDLCALC 134 (H) 08/12/2019   Lab Results  Component Value Date   TRIG 129 08/12/2019   Lab Results  Component Value Date   CHOLHDL 4.2 08/12/2019   No results found for: HGBA1C    Assessment & Plan:   Problem List Items Addressed This Visit   None Visit Diagnoses     Physical exam    -  Primary   Relevant Orders   Basic metabolic panel   Lipid panel   CBC with Differential/Platelet   TSH   Hepatic function panel   Need for Tdap vaccination       Relevant Orders   Tdap vaccine greater than or equal to 7yo IM (Completed)     -We discussed the following health maintenance items  -Patient requesting Tdap.  This was given.  Other vaccines up-to-date.  -Continue regular weightbearing exercise  -She will continue with mammograms and Pap smears through GYN.  She is scheduled for repeat mammogram this April.  No orders of the defined types were placed in this encounter.   Follow-up: No follow-ups on file.    Carolann Littler, MD

## 2021-02-08 NOTE — Telephone Encounter (Signed)
Patient was seen in office for her physical and requested a Tdap vaccine. I informed the patient that [employer] will not cover this here in the office and advised that she get this at her pharmacy since it is around $300-$400 here in office without insurance. She expressed understanding. During her visit she spoke with Dr. Caryl Never about this and decided to go ahead and get the vaccine here in office.   Below is the message from Dr. Caryl Never Via Teams.  [10:52 AM] Donna Shaw, Donna Shaw does want Tdap-   grandchild one month old.    she is aware insurance may not cover, although her husband's Tdap was covered here and he has same plan

## 2021-04-27 ENCOUNTER — Ambulatory Visit
Admission: RE | Admit: 2021-04-27 | Discharge: 2021-04-27 | Disposition: A | Discharge status: Home / Self Care | Payer: Medicare PPO | Source: Ambulatory Visit | Attending: Obstetrics | Admitting: Obstetrics

## 2021-04-27 DIAGNOSIS — R921 Mammographic calcification found on diagnostic imaging of breast: Secondary | ICD-10-CM | POA: Diagnosis not present

## 2021-08-10 ENCOUNTER — Encounter (HOSPITAL_BASED_OUTPATIENT_CLINIC_OR_DEPARTMENT_OTHER): Payer: Self-pay | Admitting: Emergency Medicine

## 2021-08-10 ENCOUNTER — Other Ambulatory Visit: Payer: Self-pay

## 2021-08-10 ENCOUNTER — Other Ambulatory Visit (HOSPITAL_BASED_OUTPATIENT_CLINIC_OR_DEPARTMENT_OTHER): Payer: Self-pay

## 2021-08-10 ENCOUNTER — Emergency Department (HOSPITAL_BASED_OUTPATIENT_CLINIC_OR_DEPARTMENT_OTHER): Payer: Medicare PPO

## 2021-08-10 ENCOUNTER — Emergency Department (HOSPITAL_BASED_OUTPATIENT_CLINIC_OR_DEPARTMENT_OTHER)
Admission: EM | Admit: 2021-08-10 | Discharge: 2021-08-10 | Disposition: A | Discharge status: Home / Self Care | Payer: Medicare PPO | Attending: Emergency Medicine | Admitting: Emergency Medicine

## 2021-08-10 DIAGNOSIS — R29818 Other symptoms and signs involving the nervous system: Secondary | ICD-10-CM | POA: Diagnosis not present

## 2021-08-10 DIAGNOSIS — R42 Dizziness and giddiness: Secondary | ICD-10-CM | POA: Diagnosis not present

## 2021-08-10 LAB — COMPREHENSIVE METABOLIC PANEL
ALT: 11 U/L (ref 0–44)
AST: 19 U/L (ref 15–41)
Albumin: 4.7 g/dL (ref 3.5–5.0)
Alkaline Phosphatase: 75 U/L (ref 38–126)
Anion gap: 15 (ref 5–15)
BUN: 13 mg/dL (ref 8–23)
CO2: 25 mmol/L (ref 22–32)
Calcium: 10.1 mg/dL (ref 8.9–10.3)
Chloride: 102 mmol/L (ref 98–111)
Creatinine, Ser: 1 mg/dL (ref 0.44–1.00)
GFR, Estimated: >60 mL/min (ref >60)
Glucose, Bld: 112 mg/dL — ABNORMAL HIGH (ref 70–99)
Potassium: 3.6 mmol/L (ref 3.5–5.1)
Sodium: 142 mmol/L (ref 135–145)
Total Bilirubin: 0.5 mg/dL (ref 0.3–1.2)
Total Protein: 7.3 g/dL (ref 6.5–8.1)

## 2021-08-10 LAB — TROPONIN I (HIGH SENSITIVITY): Troponin I (High Sensitivity): 3 ng/L (ref <18)

## 2021-08-10 LAB — CBC
HCT: 43.9 % (ref 36.0–46.0)
Hemoglobin: 14.6 g/dL (ref 12.0–15.0)
MCH: 31.1 pg (ref 26.0–34.0)
MCHC: 33.3 g/dL (ref 30.0–36.0)
MCV: 93.4 fL (ref 80.0–100.0)
Platelets: 421 10*3/uL — ABNORMAL HIGH (ref 150–400)
RBC: 4.7 MIL/uL (ref 3.87–5.11)
RDW: 12.9 % (ref 11.5–15.5)
WBC: 7.4 10*3/uL (ref 4.0–10.5)
nRBC: 0 % (ref 0.0–0.2)

## 2021-08-10 LAB — CBG MONITORING, ED: Glucose-Capillary: 102 mg/dL — ABNORMAL HIGH (ref 70–99)

## 2021-08-10 MED ORDER — MECLIZINE HCL 25 MG PO TABS
25.0000 mg | ORAL_TABLET | Freq: Once | ORAL | Status: AC
  Administered 2021-08-10: 25 mg via ORAL
  Filled 2021-08-10: qty 1

## 2021-08-10 MED ORDER — MECLIZINE HCL 25 MG PO TABS
25.0000 mg | ORAL_TABLET | Freq: Three times a day (TID) | ORAL | 0 refills | Status: DC | PRN
  Filled 2021-08-10: qty 30, 10d supply, fill #0

## 2021-08-10 NOTE — ED Provider Notes (Signed)
Emergency Department Provider Note   I have reviewed the triage vital signs and the nursing notes.   HISTORY  Chief Complaint Dizziness   HPI Donna Shaw is a 66 y.o. female with past history reviewed including arthritis presents emergency department with vertigo type symptoms upon awaking this morning.  Patient states that she rolled over in bed to the right with her eyes open and felt dizziness/spinning sensation.  She denies constant or persistent symptoms.  She was able to go back to sleep but then rolled over onto the left with return of symptoms.  She called for her husband and was able to get out of bed with some monitoring but no significant assistance.  She is continue to feel "brain fog" type symptoms this morning but was able to go about many of her morning tasks.  She is noticed on 2 occasions with bending over that she had return of vertigo, 1 of which causing her to fall to the side into the bed.  No head trauma.  No severe headaches.  She did have some mild discomfort in the right ear yesterday but nothing persistent.  No tinnitus.  No unilateral numbness or weakness.  No speech or vision disturbance.  No prior history of vertigo or stroke.   Past Medical History:  Diagnosis Date   Arthritis    osteoarthritis neck   Chicken pox     Review of Systems  Constitutional: No fever/chills Eyes: No visual changes. ENT: No sore throat. Positive vertigo.  Cardiovascular: Denies chest pain. Respiratory: Denies shortness of breath. Gastrointestinal: No abdominal pain.  No nausea, no vomiting.  No diarrhea.  No constipation. Genitourinary: Negative for dysuria. Musculoskeletal: Negative for back pain. Skin: Negative for rash. Neurological: Negative for headaches, focal weakness or numbness.   ____________________________________________   PHYSICAL EXAM:  VITAL SIGNS: ED Triage Vitals  Enc Vitals Group     BP 08/10/21 1046 (!) 148/70     Pulse Rate 08/10/21 1046  88     Resp 08/10/21 1046 18     Temp 08/10/21 1046 98.1 F (36.7 C)     Temp Source 08/10/21 1046 Oral     SpO2 08/10/21 1046 99 %     Weight 08/10/21 1044 170 lb (77.1 kg)     Height 08/10/21 1044 5\' 3"  (1.6 m)   Constitutional: Alert and oriented. Well appearing and in no acute distress. Eyes: Conjunctivae are normal. PERRL. EOMI. No appreciable nystagmus.  Head: Atraumatic. Nose: No congestion/rhinnorhea. Mouth/Throat: Mucous membranes are moist.  Neck: No stridor.  Cardiovascular: Normal rate, regular rhythm. Good peripheral circulation. Grossly normal heart sounds.   Respiratory: Normal respiratory effort.  No retractions. Lungs CTAB. Gastrointestinal: Soft and nontender. No distention.  Musculoskeletal: No lower extremity tenderness nor edema. No gross deformities of extremities. Neurologic:  Normal speech and language. No gross focal neurologic deficits are appreciated.  No facial asymmetry.  5/5 strength in the bilateral upper and lower extremities.  No pronator drift.  Normal finger-to-nose testing.  Normal heel-to-shin and rapid alternating movements. Skin:  Skin is warm, dry and intact. No rash noted.  ____________________________________________   LABS (all labs ordered are listed, but only abnormal results are displayed)  Labs Reviewed  CBC - Abnormal; Notable for the following components:      Result Value   Platelets 421 (*)    All other components within normal limits  COMPREHENSIVE METABOLIC PANEL - Abnormal; Notable for the following components:   Glucose, Bld 112 (*)  All other components within normal limits  CBG MONITORING, ED - Abnormal; Notable for the following components:   Glucose-Capillary 102 (*)    All other components within normal limits  TROPONIN I (HIGH SENSITIVITY)   ____________________________________________  EKG   EKG Interpretation  Date/Time:  Wednesday August 10 2021 10:49:18 EDT Ventricular Rate:  81 PR Interval:  132 QRS  Duration: 116 QT Interval:  388 QTC Calculation: 451 R Axis:   -26 Text Interpretation: Sinus rhythm Nonspecific intraventricular conduction delay Low voltage, precordial leads Minimal ST depression, lateral leads Confirmed by Alona Bene [phone]) on 08/10/2021 11:04:14 AM        ____________________________________________  RADIOLOGY  CT Head Wo Contrast  Result Date: 08/10/2021 CLINICAL DATA:  Neural deficit. EXAM: CT HEAD WITHOUT CONTRAST TECHNIQUE: Contiguous axial images were obtained from the base of the skull through the vertex without intravenous contrast. RADIATION DOSE REDUCTION: This exam was performed according to the departmental dose-optimization program which includes automated exposure control, adjustment of the mA and/or kV according to patient size and/or use of iterative reconstruction technique. COMPARISON:  None Available. FINDINGS: Brain: No evidence of acute infarction, hemorrhage, hydrocephalus, extra-axial collection or mass lesion/mass effect. Vascular: No hyperdense vessel or unexpected calcification. Skull: Normal. Negative for fracture or focal lesion. Sinuses/Orbits: No acute finding. Other: None. IMPRESSION: No acute intracranial abnormality. Electronically Signed   By: Ted Mcalpine M.D.   On: 08/10/2021 11:46    ____________________________________________   PROCEDURES  Procedure(s) performed:   Procedures  None  ____________________________________________   INITIAL IMPRESSION / ASSESSMENT AND PLAN / ED COURSE  Pertinent labs & imaging results that were available during my care of the patient were reviewed by me and considered in my medical decision making (see chart for details).   This patient is Presenting for Evaluation of vertigo, which does require a range of treatment options, and is a complaint that involves a high risk of morbidity and mortality.  The Differential Diagnoses include BPPV, Mnire's disease, central CVA, dehydration,  arrhythmia.  Critical Interventions-    Medications  meclizine (ANTIVERT) tablet 25 mg (25 mg Oral Given 08/10/21 1150)    Reassessment after intervention: Symptoms improved. Ambulatory without difficulty.    I did obtain Additional Historical Information from husband at bedside.   I decided to review pertinent External Data, and in summary no prior history of ED visits for similar.   Clinical Laboratory Tests Ordered, included blood sugar of 102.  No leukocytosis or anemia.  No acute kidney injury.  Troponin normal.  Radiologic Tests Ordered, included CT head. I independently interpreted the images and agree with radiology interpretation.   Cardiac Monitor Tracing which shows NSR.   Social Determinants of Health Risk patient is a non-smoker.    Medical Decision Making: Summary:  Patient presents emergency department evaluation of intermittent vertigo starting this morning.  Last normal at 10:30 PM yesterday.  Low suspicion after exam and through history for central vertigo process.  Seems most prominent with movement and the patient's neurologic exam, including cerebellar testing is normal.  Plan for CT imaging of the head along with labs, meclizine, reassess.  Reevaluation with update and discussion with patient at bedside after meclizine.  She is feeling improved with only mild symptoms with movement.  She is able to get up with me in the room and ambulate at her baseline.  She is able to bend over without significant dizziness and look downward which had started her symptoms earlier.  We discussed that overall  my suspicion for central vertigo/stroke is exceedingly low and I do not feel that she needs an emergency MRI.  My plan is for meclizine, Epley maneuver, PCP follow-up and will give contact information for the ENT on-call to contact if symptoms persist for vestibular rehab referral.   Considered admission but symptoms are improved here and patient ambulatory without difficulty.   My overall suspicion for stroke is very low and do not feel she needs further inpatient work-up.  Plan for ENT follow-up along with follow-up with her PCP.  Strict ED return precautions given.  Disposition: discharge  ____________________________________________  FINAL CLINICAL IMPRESSION(S) / ED DIAGNOSES  Final diagnoses:  Vertigo     NEW OUTPATIENT MEDICATIONS STARTED DURING THIS VISIT:  Discharge Medication List as of 08/10/2021  1:08 PM     START taking these medications   Details  meclizine (ANTIVERT) 25 MG tablet Take 1 tablet (25 mg total) by mouth 3 (three) times daily as needed for dizziness., Starting Wed 08/10/2021, Normal        Note:  This document was prepared using Dragon voice recognition software and may include unintentional dictation errors.  Nanda Quinton, MD, [facility]    Tonda Wiederhold, W[facility], MD 08/11/21 [phone]

## 2021-08-10 NOTE — Discharge Instructions (Addendum)
We believe your symptoms were caused by benign vertigo.  Please read through the included information and take any prescribed medication(s).  Follow up with your doctor as listed above.  If you develop any new or worsening symptoms that concern you, including but not limited to persistent dizziness/vertigo, numbness or weakness in your arms or legs, altered mental status, persistent vomiting, or fever greater than 101, please return immediately to the Emergency Department.  

## 2021-08-10 NOTE — ED Triage Notes (Signed)
Pt arrives to ED with c/o dizziness, lightheadedness, and off-balance that started when she woke up at 6am, LKN 1030 8/1.

## 2021-08-10 NOTE — ED Notes (Signed)
RT note: U/A obtained/labelled/given to Lab, RN made aware. 

## 2021-08-10 NOTE — ED Notes (Signed)
Discharge paperwork given and verbally understood. 

## 2021-08-17 ENCOUNTER — Ambulatory Visit (INDEPENDENT_AMBULATORY_CARE_PROVIDER_SITE_OTHER): Payer: Medicare PPO | Admitting: Family Medicine

## 2021-08-17 ENCOUNTER — Encounter: Payer: Self-pay | Admitting: Family Medicine

## 2021-08-17 VITALS — BP 146/60 | HR 75 | Temp 98.1°F | Ht 63.0 in | Wt 176.3 lb

## 2021-08-17 DIAGNOSIS — H81391 Other peripheral vertigo, right ear: Secondary | ICD-10-CM

## 2021-08-17 MED ORDER — MECLIZINE HCL 25 MG PO TABS: 25.0000 mg | ORAL_TABLET | Freq: Three times a day (TID) | ORAL | 0 refills | Status: DC | PRN

## 2021-08-17 NOTE — Progress Notes (Signed)
Established Patient Office Visit  Subjective   Patient ID: Donna Shaw, female    DOB: 11-22-1955  Age: 66 y.o. MRN: [id number]  Chief Complaint  Patient presents with   Vertebral Fracture   Dizziness    Patient complains of vertigo    HPI   Donna Shaw is seen for follow-up regarding recent visit to ER for vertigo.  She states she has never had vertigo previously.  She went to the ER on 2 August.  She basically woke up after turning over in bed rolling over to the right and had spinning sensation and dizziness.  She may have had some milder sensation with turning to the left as well.  She had to call her husband to get some assistance getting out of bed.  No recent head trauma.  No headaches.  No focal weakness.  Denied any tinnitus or acute hearing changes.  No ataxia.  No focal weakness.  No speech changes.  ER workup reviewed.  EKG was unremarkable.  CT head showed no acute abnormalities.  CBC, CMP, troponin unremarkable.  She was prescribed meclizine.  She did not have any significant nausea.  She was instructed to do Epley maneuvers and try this a few times and thinks they may have helped some.  She actually is doing considerably better until last night when she had recurrence after again rolling over.  Has had some mild to moderate symptoms today.  No new symptoms.  Past Medical History:  Diagnosis Date   Arthritis    osteoarthritis neck   Chicken pox    Past Surgical History:  Procedure Laterality Date   MOUTH SURGERY     TUBAL LIGATION      reports that she has never smoked. She has never used smokeless tobacco. She reports current alcohol use of about 1.0 standard drink of alcohol per week. She reports that she does not use drugs. family history includes Alcohol abuse in her brother; Cancer in her father and mother; Diabetes in her brother. Allergies  Allergen Reactions   Codeine     hives   Sulfamethoxazole-Tmp Ds [Sulfamethoxazole-Trimethoprim]     Pt is able to take  medication however, she would prefer NOT to due to medication making her feel nauseated.    Review of Systems  Constitutional:  Negative for chills and fever.  Respiratory:  Negative for cough and shortness of breath.   Cardiovascular:  Negative for chest pain.  Genitourinary:  Negative for dysuria.  Neurological:  Positive for dizziness. Negative for tremors, speech change, focal weakness, seizures, loss of consciousness, weakness and headaches.      Objective:     BP (!) 146/60   Pulse 75   Temp 98.1 F (36.7 C) (Oral)   Ht 5\' 3"  (1.6 m)   Wt 176 lb 4.8 oz (80 kg)   LMP 06/16/2010   SpO2 98%   BMI 31.23 kg/m    Physical Exam Vitals reviewed.  Constitutional:      General: She is not in acute distress.    Appearance: Normal appearance.  HENT:     Right Ear: Tympanic membrane normal.     Left Ear: Tympanic membrane normal.  Neck:     Comments: No carotid bruit Cardiovascular:     Rate and Rhythm: Normal rate and regular rhythm.  Pulmonary:     Effort: Pulmonary effort is normal.     Breath sounds: Normal breath sounds.  Musculoskeletal:     Cervical back: Neck supple.  Right lower leg: No edema.     Left lower leg: No edema.  Neurological:     General: No focal deficit present.     Mental Status: She is alert and oriented to person, place, and time.     Cranial Nerves: No cranial nerve deficit.     Motor: No weakness.     Coordination: Coordination normal.     Comments: Extraocular movements are normal.  She does have some mild horizontal nystagmus.  No vertical nystagmus.  Vertigo symptoms were reproduced with patient turning head 45 degrees to the right and lying supine but not to the left.      No results found for any visits on 08/17/21.    The 10-year ASCVD risk score (Arnett DK, et al., 2019) is: 8%    Assessment & Plan:   Patient presents with onset about a week ago of vertigo symptoms worse with turning to the right.  Suspect benign  peripheral positional vertigo.  ER workup unrevealing.  She does not have any red flags such as speech change, swallowing difficulties, ataxia, etc. to suggest central cause.  Symptoms were improving and then reflared yesterday again.  Symptoms were reproduced in office today with head turned to the right 45 degrees  -We gave handout on Epley maneuvers and have recommended he do these at least 3 times daily until symptoms fully cleared for 24 hours -Reviewed red flag symptoms of more worrisome vertigo -If symptoms not fully relieved by Monday consider referral for physical therapy for vestibular rehab. -We did refill her meclizine for as needed use   No follow-ups on file.    Evelena Peat, MD

## 2021-08-23 ENCOUNTER — Telehealth: Payer: Self-pay | Admitting: Family Medicine

## 2021-08-23 DIAGNOSIS — H81391 Other peripheral vertigo, right ear: Secondary | ICD-10-CM

## 2021-08-23 NOTE — Telephone Encounter (Signed)
Referral placed and pt aware   

## 2021-08-23 NOTE — Telephone Encounter (Signed)
Pt states she is still waking up very dizzy in the mornings. Pt is asking if MD could please expedite the PT referral he offered her during her last visit?  Please advise. [phone]

## 2021-08-29 NOTE — Therapy (Signed)
OUTPATIENT PHYSICAL THERAPY VESTIBULAR EVALUATION     Patient Name: Donna Shaw MRN: [id number] DOB:1955-11-02, 66 y.o., female Today's Date: 08/29/2021  PCP: Kristian Covey, MD REFERRING PROVIDER: Kristian Covey, MD    Past Medical History:  Diagnosis Date   Arthritis    osteoarthritis neck   Chicken pox    Past Surgical History:  Procedure Laterality Date   MOUTH SURGERY     TUBAL LIGATION     Patient Active Problem List   Diagnosis Date Noted   Osteoarthritis of neck 01/22/2012   Raynauds phenomenon 01/22/2012    ONSET DATE: 08/10/21  REFERRING DIAG: H81.391 (ICD-10-CM) - Vertigo, peripheral, right  THERAPY DIAG:  No diagnosis found.  Rationale for Evaluation and Treatment Rehabilitation  SUBJECTIVE:   SUBJECTIVE STATEMENT: Patient reports that dizziness started on August 2nd when she woke up. Had spinning on her R side, even worse when she rolled to the L. Today she is feeling better, now sleeping with her head propped up. Episodes are described as "spinning/jumping, intoxicated" and last most of the day. Worse with laying flat. Denies head trauma, infection/illness, vision changes/double vision, hearing loss, tinnitus, otalgia, photo/phonophobia. Started using SPC since this episode. Was told to try the Epley which she has done to B sides with her husband.   Pt accompanied by: self  PERTINENT HISTORY: none   PAIN:  Are you having pain? No  PRECAUTIONS: Fall  WEIGHT BEARING RESTRICTIONS No  FALLS: Has patient fallen in last 6 months? No  LIVING ENVIRONMENT: Lives with: lives with their spouse Lives in: House/apartment Stairs: Yes: Internal: 2 steps; none and External: 1 story steps;   Has following equipment at home: Single point cane  PLOF: Independent; retired   PATIENT GOALS: improve dizziness to allow her to go on a trip to [employer] in September  OBJECTIVE:   DIAGNOSTIC FINDINGS: 08/10/21 head CT: No acute intracranial  abnormality  COGNITION: Overall cognitive status: Within functional limits for tasks assessed   SENSATION: WFL  POSTURE: rounded shoulders, forward head, increased thoracic kyphosis, and posterior pelvic tilt   GAIT: Gait pattern: decreased step length- Right, decreased step length- Left, and trunk flexed Assistive device utilized: Single point cane Level of assistance: Modified independence  PATIENT SURVEYS:  FOTO [phone]   VESTIBULAR ASSESSMENT   GENERAL OBSERVATION: patient is wearing progressive lenses which she wears "always"    OCULOMOTOR EXAM:   Ocular Alignment: normal   Ocular ROM: No Limitations   Spontaneous Nystagmus: absent   Gaze-Induced Nystagmus: absent   Smooth Pursuits: intact and 1 saccade- normal for age ; c/o mild dizziness   Saccades: intact; c/o mild dizziness     VESTIBULAR - OCULAR REFLEX:    Slow VOR: Normal and Comment: slow and delayed onset dizziness in horizontal direction   VOR Cancellation: Normal; c/o dizziness    Head-Impulse Test: HIT Right: negative HIT Left: negative *C/o dizziness     POSITIONAL TESTING:   *patient with upbeating nystagmus upon laying supine which resolved upon stopping movement  Right Roll Test: negative Left Roll Test: negative Right Dix-Hallpike: negative Left Dix-Hallpike: L upbeating torsional nystagmus lasting ~10 sec 2nd L DH: negative   VESTIBULAR TREATMENT:  Canalith Repositioning:   Epley Left: Number of Reps: 1, Response to Treatment: symptoms improved, and Comment: tolerated well    PATIENT EDUCATION: Education details: prognosis, POC, HEP, exam findings Person educated: Patient Education method: Explanation, Demonstration, Tactile cues, Verbal cues, and Handouts Education comprehension: verbalized understanding   GOALS:  Goals reviewed with patient? Yes  SHORT TERM GOALS: Target date: 09/13/2021  Patient to be independent with initial HEP. Baseline: HEP initiated Goal status:  INITIAL    LONG TERM GOALS: Target date: 09/27/2021  Patient to be independent with advanced HEP. Baseline: Not yet initiated  Goal status: INITIAL  Patient to report 0/10 dizziness with standing vertical and horizontal VOR for 30 seconds. Baseline: Unable Goal status: INITIAL  Patient will report 0/10 dizziness with bed mobility.  Baseline: Symptomatic  Goal status: INITIAL  Patient to score at least 20/24 on DGI in order to decrease risk of falls. Baseline: NT Goal status: INITIAL    ASSESSMENT:  CLINICAL IMPRESSION:   Patient is a 66 y/o F presenting to OPPT with c/o dizziness since 08/10/21. Episodes are described as "spinning/jumping, intoxicated" and last most of the day. Worse with laying flat and rolling. Denies head trauma, infection/illness, vision changes/double vision, hearing loss, tinnitus, otalgia, photo/phonophobia. Oculomotor exam revealed dizziness with saccades and smooth pursuits, horizontal VOR, and VOR cancellation. Positional testing was positive for L DH. Patient was treated with L Epley which she tolerated well. Patient was educated on gentle VOR and habituation HEP and reported understanding. Would benefit from skilled PT services 1-2x/week for 4 weeks to address aforementioned impairments in order to optimize level of function.     OBJECTIVE IMPAIRMENTS Abnormal gait, decreased balance, and dizziness.   ACTIVITY LIMITATIONS carrying, lifting, bending, sleeping, bed mobility, bathing, and locomotion level  PARTICIPATION LIMITATIONS: meal prep, cleaning, laundry, driving, shopping, community activity, yard work, and church  PERSONAL FACTORS Age, Past/current experiences, Time since onset of injury/illness/exacerbation, and Transportation are also affecting patient's functional outcome.   REHAB POTENTIAL: Good  CLINICAL DECISION MAKING: Evolving/moderate complexity  EVALUATION COMPLEXITY: Moderate   PLAN: PT FREQUENCY: 1-2x/week  PT DURATION: 4  weeks  PLANNED INTERVENTIONS: Therapeutic exercises, Therapeutic activity, Neuromuscular re-education, Balance training, Gait training, Patient/Family education, Self Care, Joint mobilization, Stair training, Vestibular training, Canalith repositioning, Dry Needling, Electrical stimulation, Cryotherapy, Moist heat, Taping, Manual therapy, and Re-evaluation  PLAN FOR NEXT SESSION: reassess L DH, DGI, progress VOR and habituation    Anette Guarneri, PT, DPT 08/30/21 5:03 PM  [facility] at [facility] [address], [address] [place], [place] [address] Phone # [phone] Fax # [phone]

## 2021-08-30 ENCOUNTER — Other Ambulatory Visit: Payer: Self-pay

## 2021-08-30 ENCOUNTER — Ambulatory Visit: Payer: Medicare PPO | Attending: Family Medicine | Admitting: Physical Therapy

## 2021-08-30 ENCOUNTER — Encounter: Payer: Self-pay | Admitting: Physical Therapy

## 2021-08-30 DIAGNOSIS — R42 Dizziness and giddiness: Secondary | ICD-10-CM | POA: Diagnosis not present

## 2021-08-30 DIAGNOSIS — R2681 Unsteadiness on feet: Secondary | ICD-10-CM | POA: Diagnosis not present

## 2021-08-30 DIAGNOSIS — H8112 Benign paroxysmal vertigo, left ear: Secondary | ICD-10-CM | POA: Insufficient documentation

## 2021-08-31 NOTE — Therapy (Signed)
OUTPATIENT PHYSICAL THERAPY VESTIBULAR TREATMENT     Patient Name: Donna Shaw MRN: [id number] DOB:[birth year], 66 y.o., female Today's Date: 09/01/2021  PCP: Eulas Post, MD REFERRING PROVIDER: Eulas Post, MD   PT End of Session - 09/01/21 1703     Visit Number 2    Number of Visits 9    Date for PT Re-Evaluation 09/27/21    Authorization Type [employer]    Authorization Time Period 9 visits approved from 08/30/21-09/27/21    Authorization - Visit Number 2    Authorization - Number of Visits 9    PT Start Time 1534    PT Stop Time 1612    PT Time Calculation (min) 38 min    Equipment Utilized During Treatment Gait belt    Activity Tolerance Patient tolerated treatment well    Behavior During Therapy WFL for tasks assessed/performed             Past Medical History:  Diagnosis Date   Arthritis    osteoarthritis neck   Chicken pox    Past Surgical History:  Procedure Laterality Date   MOUTH SURGERY     TUBAL LIGATION     Patient Active Problem List   Diagnosis Date Noted   Osteoarthritis of neck 01/22/2012   Raynauds phenomenon 01/22/2012    ONSET DATE: 08/10/21  REFERRING DIAG: H81.391 (ICD-10-CM) - Vertigo, peripheral, right  THERAPY DIAG:  BPPV (benign paroxysmal positional vertigo), left  Dizziness and giddiness  Unsteadiness on feet  Rationale for Evaluation and Treatment Rehabilitation  SUBJECTIVE:   SUBJECTIVE STATEMENT: Feeling better and has been doing the exercises. No dizziness currently. Felt some eye movement while doing one of her exercises to the R side.   Pt accompanied by: self  PERTINENT HISTORY: none   PAIN:  Are you having pain? No  PRECAUTIONS: Fall  PATIENT GOALS: improve dizziness to allow her to go on a trip to [place] in September  OBJECTIVE:    TODAY'S TREATMENT: 09/01/21 Activity Comments  L DH Negative; mild dizziness upon sitting up  R DH Negative; mild dizziness upon sitting up   R/L rolling 2x each C/o mild dizziness in each direction; no nystagmus   R/L brandt daroff 2x  C/o mild dizziness upon siting up; requiring correction for form/positioning; cues to increase speed  sitting VOR horizontal 2x30"  Cues to reduce ROM and increase speed; improved on 2nd set  Romberg VOR horizontal 2x30" Good speed; c/o dizziness   1/2 turns to targets CGA c/o very mild wooziness   standing wide on foam EC 2x30" Moderate sway and some fear of falling; CGA  Romberg on foam 30" CGA    HOME EXERCISE PROGRAM Last updated: 09/01/21 Access Code: [email or web address] URL: [email or web address] Date: 09/01/2021 Prepared by: [facility]  Program Notes perform standing exercises in a corner for safety  Exercises - Brandt-Daroff Vestibular Exercise  - 1 x daily - 5 x weekly - 2 sets - 3-5 reps - Supine to Right Sidelying Vestibular Habituation  - 1 x daily - 5 x weekly - 2 sets - 3-5 reps - Standing Gaze Stabilization with Head Rotation  - 1 x daily - 5 x weekly - 2-3 sets - 30 sec hold - Standing Balance with Eyes Closed on Foam  - 1 x daily - 5 x weekly - 3 sets - 30 sec hold  PATIENT EDUCATION: Education details: HEP update; edu on vestibular system function  Person  educated: Patient Education method: Explanation, Demonstration, Tactile cues, Verbal cues, and Handouts Education comprehension: verbalized understanding and returned demonstration    Below measures were taken at time of initial evaluation unless otherwise specified:   DIAGNOSTIC FINDINGS: 08/10/21 head CT: No acute intracranial abnormality  COGNITION: Overall cognitive status: Within functional limits for tasks assessed   SENSATION: WFL  POSTURE: rounded shoulders, forward head, increased thoracic kyphosis, and posterior pelvic tilt   GAIT: Gait pattern: decreased step length- Right, decreased step length- Left, and trunk flexed Assistive device utilized: Single  point cane Level of assistance: Modified independence  PATIENT SURVEYS:  FOTO [phone]   VESTIBULAR ASSESSMENT   GENERAL OBSERVATION: patient is wearing progressive lenses which she wears "always"    OCULOMOTOR EXAM:   Ocular Alignment: normal   Ocular ROM: No Limitations   Spontaneous Nystagmus: absent   Gaze-Induced Nystagmus: absent   Smooth Pursuits: intact and 1 saccade- normal for age ; c/o mild dizziness   Saccades: intact; c/o mild dizziness     VESTIBULAR - OCULAR REFLEX:    Slow VOR: Normal and Comment: slow and delayed onset dizziness in horizontal direction   VOR Cancellation: Normal; c/o dizziness    Head-Impulse Test: HIT Right: negative HIT Left: negative *C/o dizziness     POSITIONAL TESTING:   *patient with upbeating nystagmus upon laying supine which resolved upon stopping movement  Right Roll Test: negative Left Roll Test: negative Right Dix-Hallpike: negative Left Dix-Hallpike: L upbeating torsional nystagmus lasting ~10 sec 2nd L DH: negative   VESTIBULAR TREATMENT:  Canalith Repositioning:   Epley Left: Number of Reps: 1, Response to Treatment: symptoms improved, and Comment: tolerated well   Access Code: [email or web address] URL: [email or web address] Date: 08/31/2021 Prepared by: [facility]  Exercises - Brandt-Daroff Vestibular Exercise  - 1 x daily - 5 x weekly - 2 sets - 3-5 reps - Seated Gaze Stabilization with Head Rotation  - 1 x daily - 5 x weekly - 2-3 sets - 30 sec hold - Supine to Right Sidelying Vestibular Habituation  - 1 x daily - 5 x weekly - 2 sets - 3-5 reps   PATIENT EDUCATION: Education details: prognosis, POC, HEP, exam findings Person educated: Patient Education method: Explanation, Demonstration, Tactile cues, Verbal cues, and Handouts Education comprehension: verbalized understanding   GOALS: Goals reviewed with patient? Yes  SHORT TERM GOALS: Target date:  09/13/2021  Patient to be independent with initial HEP. Baseline: HEP initiated Goal status: MET    LONG TERM GOALS: Target date: 09/27/2021  Patient to be independent with advanced HEP. Baseline: Not yet initiated  Goal status: IN PROGRESS  Patient to report 0/10 dizziness with standing vertical and horizontal VOR for 30 seconds. Baseline: Unable Goal status: IN PROGRESS  Patient will report 0/10 dizziness with bed mobility.  Baseline: Symptomatic  Goal status: IN PROGRESS  Patient to score at least 20/24 on DGI in order to decrease risk of falls. Baseline: NT Goal status: IN PROGRESS    ASSESSMENT:  CLINICAL IMPRESSION: Patient arrived to session with report of improving dizziness. Positional testing was negative however with remaining motion sensitivity with rolling and sitting up from [facility]. Worked on habituating these movements at quicker speeds. Also adjusted speed/amplitude of VOR movements to elicit mild symptoms of dizziness. Worked on balance activities on compliant surface, which revealed some hesitation and imbalance when prompting patient to close eyes. Updated HEP with exercises that were performed today and educated patient on safe performance  of these at home. Patient reported understanding and without complaints upon leaving.    OBJECTIVE IMPAIRMENTS Abnormal gait, decreased balance, and dizziness.   ACTIVITY LIMITATIONS carrying, lifting, bending, sleeping, bed mobility, bathing, and locomotion level  PARTICIPATION LIMITATIONS: meal prep, cleaning, laundry, driving, shopping, community activity, yard work, and church  PERSONAL FACTORS Age, Past/current experiences, Time since onset of injury/illness/exacerbation, and Transportation are also affecting patient's functional outcome.   REHAB POTENTIAL: Good  CLINICAL DECISION MAKING: Evolving/moderate complexity  EVALUATION COMPLEXITY: Moderate   PLAN: PT FREQUENCY: 1-2x/week  PT DURATION: 4  weeks  PLANNED INTERVENTIONS: Therapeutic exercises, Therapeutic activity, Neuromuscular re-education, Balance training, Gait training, Patient/Family education, Self Care, Joint mobilization, Stair training, Vestibular training, Canalith repositioning, Dry Needling, Electrical stimulation, Cryotherapy, Moist heat, Taping, Manual therapy, and Re-evaluation  PLAN FOR NEXT SESSION: progress VOR and habituation, balance on foam and EC    Janene Harvey, PT, DPT 09/01/21 5:05 PM  [facility] at [facility] [address], [place], [place] [address] Phone # [phone] Fax # [phone]

## 2021-09-01 ENCOUNTER — Ambulatory Visit: Payer: Medicare PPO | Admitting: Physical Therapy

## 2021-09-01 ENCOUNTER — Encounter: Payer: Self-pay | Admitting: Physical Therapy

## 2021-09-01 DIAGNOSIS — R42 Dizziness and giddiness: Secondary | ICD-10-CM | POA: Diagnosis not present

## 2021-09-01 DIAGNOSIS — R2681 Unsteadiness on feet: Secondary | ICD-10-CM | POA: Diagnosis not present

## 2021-09-01 DIAGNOSIS — H8112 Benign paroxysmal vertigo, left ear: Secondary | ICD-10-CM | POA: Diagnosis not present

## 2021-09-05 ENCOUNTER — Ambulatory Visit: Payer: Medicare PPO | Admitting: Physical Therapy

## 2021-09-05 ENCOUNTER — Encounter: Payer: Self-pay | Admitting: Physical Therapy

## 2021-09-05 DIAGNOSIS — H8112 Benign paroxysmal vertigo, left ear: Secondary | ICD-10-CM | POA: Diagnosis not present

## 2021-09-05 DIAGNOSIS — R2681 Unsteadiness on feet: Secondary | ICD-10-CM | POA: Diagnosis not present

## 2021-09-05 DIAGNOSIS — R42 Dizziness and giddiness: Secondary | ICD-10-CM

## 2021-09-05 NOTE — Therapy (Signed)
OUTPATIENT PHYSICAL THERAPY VESTIBULAR TREATMENT     Patient Name: Donna Shaw MRN: [id number] DOB:[birth year], 66 y.o., female Today's Date: 09/05/2021  PCP: Eulas Post, MD REFERRING PROVIDER: Eulas Post, MD   PT End of Session - 09/05/21 1524     Visit Number 3    Number of Visits 9    Date for PT Re-Evaluation 09/27/21    Authorization Type [employer]    Authorization Time Period 9 visits approved from 08/30/21-09/27/21    Authorization - Visit Number 3    Authorization - Number of Visits 9    PT Start Time 8546    PT Stop Time 1615    PT Time Calculation (min) 46 min    Equipment Utilized During Treatment Gait belt    Activity Tolerance Patient tolerated treatment well    Behavior During Therapy [facility] for tasks assessed/performed              Past Medical History:  Diagnosis Date   Arthritis    osteoarthritis neck   Chicken pox    Past Surgical History:  Procedure Laterality Date   MOUTH SURGERY     TUBAL LIGATION     Patient Active Problem List   Diagnosis Date Noted   Osteoarthritis of neck 01/22/2012   Raynauds phenomenon 01/22/2012    ONSET DATE: 08/10/21  REFERRING DIAG: H81.391 (ICD-10-CM) - Vertigo, peripheral, right  THERAPY DIAG:  Dizziness and giddiness  Unsteadiness on feet  Rationale for Evaluation and Treatment Rehabilitation  SUBJECTIVE:   SUBJECTIVE STATEMENT: More dizzy today.  Had a great weekend, "felt like the old me over the weekend."  Feel lightheaded more so today.  Still better than the initial times.    Rates symptoms as 4-5/10.  Pt accompanied by: self  PERTINENT HISTORY: none   PAIN:  Are you having pain? No  PRECAUTIONS: Fall  PATIENT GOALS: improve dizziness to allow her to go on a trip to [place] in September  OBJECTIVE:    TODAY'S TREATMENT: 09/05/2021 Activity Comments  Reviewed HEP additions from last visit, with pt return demo understanding   Partial tandem position with head  turns/gaze stabilization, x 10 reps, 2 sets Rates dizziness 6/10, goes back to baseline 5/10 quickly  Standing on foam:  EO-feet apart and feet together with head turns x 10 reps, light UE support No c/o dizziness  Performed L DH Negative; mild dizziness upon sitting up  Performed R DH Negative:  mild dizziness upon sitting up  Balance exercises standing on Aire[employer] in parallel bars: Marching in place x 10 reps Forward step taps/return to midline x 10 Back step taps/return to midline x 10 Side step taps/return to midline x 10 Standing feet apart with head turns x 5, head nods x 5 reps No c/o symptoms  Standing with 90 degree turns/focus on target x 3 reps, then 180 degree turns, focus on target x 3 reps No c/o symptoms  Gait with head turns, 4 reps x 20-25 ft, with mild slowing Mild c/o symptoms initial 1-2 reps   PATIENT EDUCATION: Education details: HEP updates; try Brandt-Daroff in morning (versus in afternoons) as part of habituation Person educated: Patient Education method: Explanation, Demonstration, and Handouts Education comprehension: verbalized understanding and returned demonstration  HOME EXERCISE PROGRAM Access Code: [email or web address] URL: [email or web address] Date: 09/05/2021-last update Prepared by: [facility]  Program Notes perform standing exercises in a corner for safety  Exercises - Brandt-Daroff Vestibular Exercise  -  1 x daily - 5 x weekly - 2 sets - 3-5 reps - Supine to Right Sidelying Vestibular Habituation  - 1 x daily - 5 x weekly - 2 sets - 3-5 reps - Standing Gaze Stabilization with Head Rotation  - 1 x daily - 5 x weekly - 2-3 sets - 30 sec hold - Standing Balance with Eyes Closed on Foam  - 1 x daily - 5 x weekly - 3 sets - 30 sec hold - Standing Romberg to 1/2 Tandem Stance  - 1 x daily - 5 x weekly - 2-3 sets - 30 sec hold - Walking with Head Rotation  - 1 x daily - 5 x weekly - 1 sets - 3-5 reps     Below  measures were taken at time of initial evaluation unless otherwise specified:   DIAGNOSTIC FINDINGS: 08/10/21 head CT: No acute intracranial abnormality  COGNITION: Overall cognitive status: Within functional limits for tasks assessed   SENSATION: WFL  POSTURE: rounded shoulders, forward head, increased thoracic kyphosis, and posterior pelvic tilt   GAIT: Gait pattern: decreased step length- Right, decreased step length- Left, and trunk flexed Assistive device utilized: Single point cane Level of assistance: Modified independence  PATIENT SURVEYS:  FOTO [phone]   VESTIBULAR ASSESSMENT   GENERAL OBSERVATION: patient is wearing progressive lenses which she wears "always"    OCULOMOTOR EXAM:   Ocular Alignment: normal   Ocular ROM: No Limitations   Spontaneous Nystagmus: absent   Gaze-Induced Nystagmus: absent   Smooth Pursuits: intact and 1 saccade- normal for age ; c/o mild dizziness   Saccades: intact; c/o mild dizziness     VESTIBULAR - OCULAR REFLEX:    Slow VOR: Normal and Comment: slow and delayed onset dizziness in horizontal direction   VOR Cancellation: Normal; c/o dizziness    Head-Impulse Test: HIT Right: negative HIT Left: negative *C/o dizziness     POSITIONAL TESTING:   *patient with upbeating nystagmus upon laying supine which resolved upon stopping movement  Right Roll Test: negative Left Roll Test: negative Right Dix-Hallpike: negative Left Dix-Hallpike: L upbeating torsional nystagmus lasting ~10 sec 2nd L DH: negative   VESTIBULAR TREATMENT:  Canalith Repositioning:   Epley Left: Number of Reps: 1, Response to Treatment: symptoms improved, and Comment: tolerated well   Access Code: [email or web address] URL: [email or web address] Date: 08/31/2021 Prepared by: [facility]  Exercises - Brandt-Daroff Vestibular Exercise  - 1 x daily - 5 x weekly - 2 sets - 3-5 reps - Seated Gaze Stabilization with  Head Rotation  - 1 x daily - 5 x weekly - 2-3 sets - 30 sec hold - Supine to Right Sidelying Vestibular Habituation  - 1 x daily - 5 x weekly - 2 sets - 3-5 reps   PATIENT EDUCATION: Education details: prognosis, POC, HEP, exam findings Person educated: Patient Education method: Explanation, Demonstration, Tactile cues, Verbal cues, and Handouts Education comprehension: verbalized understanding   GOALS: Goals reviewed with patient? Yes  SHORT TERM GOALS: Target date: 09/13/2021  Patient to be independent with initial HEP. Baseline: HEP initiated Goal status: MET    LONG TERM GOALS: Target date: 09/27/2021  Patient to be independent with advanced HEP. Baseline: Not yet initiated  Goal status: IN PROGRESS  Patient to report 0/10 dizziness with standing vertical and horizontal VOR for 30 seconds. Baseline: Unable Goal status: IN PROGRESS  Patient will report 0/10 dizziness with bed mobility.  Baseline: Symptomatic  Goal status: IN PROGRESS  Patient to score at least 20/24 on DGI in order to decrease risk of falls. Baseline: NT Goal status: IN PROGRESS    ASSESSMENT:  CLINICAL IMPRESSION: Pt arrives to session today with reports 4-5/10 dizziness, worse than previous two days, but better overall than at initial eval.  Pt return demo understanding of HEP; minimal cues provided for pt to use very light UE support with EC on cushion to aid with stability, as pt tends to have increased forward posture and unsteadiness with EC.  Worked on progress of VOR and balance exercises, with pt having very minor c/o dizziness by end of session.  She continues to benefit from skilled PT towards goals for improved overall functional mobility with decreased dizziness.    OBJECTIVE IMPAIRMENTS Abnormal gait, decreased balance, and dizziness.   ACTIVITY LIMITATIONS carrying, lifting, bending, sleeping, bed mobility, bathing, and locomotion level  PARTICIPATION LIMITATIONS: meal prep, cleaning,  laundry, driving, shopping, community activity, yard work, and church  PERSONAL FACTORS Age, Past/current experiences, Time since onset of injury/illness/exacerbation, and Transportation are also affecting patient's functional outcome.   REHAB POTENTIAL: Good  CLINICAL DECISION MAKING: Evolving/moderate complexity  EVALUATION COMPLEXITY: Moderate   PLAN: PT FREQUENCY: 1-2x/week  PT DURATION: 4 weeks  PLANNED INTERVENTIONS: Therapeutic exercises, Therapeutic activity, Neuromuscular re-education, Balance training, Gait training, Patient/Family education, Self Care, Joint mobilization, Stair training, Vestibular training, Canalith repositioning, Dry Needling, Electrical stimulation, Cryotherapy, Moist heat, Taping, Manual therapy, and Re-evaluation  PLAN FOR NEXT SESSION: Continue to progress VOR and habituation, balance on foam and EC; assess updates to HEP and progress as needed.     Mady Haagensen, PT 09/05/21 5:31 PM Phone: [phone] Fax: [phone]   [facility] at [facility] [place], [place], [place] [address] Phone # [phone] Fax # [phone]

## 2021-09-06 NOTE — Therapy (Signed)
OUTPATIENT PHYSICAL THERAPY VESTIBULAR TREATMENT     Patient Name: Donna Shaw MRN: [id number] DOB:04/15/1955, 66 y.o., female Today's Date: 09/07/2021  PCP: Eulas Post, MD REFERRING PROVIDER: Eulas Post, MD   PT End of Session - 09/07/21 1612     Visit Number 4    Number of Visits 9    Date for PT Re-Evaluation 09/27/21    Authorization Type [employer]    Authorization Time Period 9 visits approved from 08/30/21-09/27/21    Authorization - Visit Number 4    Authorization - Number of Visits 9    PT Start Time 7564    PT Stop Time 1612    PT Time Calculation (min) 38 min    Equipment Utilized During Treatment Gait belt    Activity Tolerance Patient tolerated treatment well    Behavior During Therapy WFL for tasks assessed/performed               Past Medical History:  Diagnosis Date   Arthritis    osteoarthritis neck   Chicken pox    Past Surgical History:  Procedure Laterality Date   MOUTH SURGERY     TUBAL LIGATION     Patient Active Problem List   Diagnosis Date Noted   Osteoarthritis of neck 01/22/2012   Raynauds phenomenon 01/22/2012    ONSET DATE: 08/10/21  REFERRING DIAG: H81.391 (ICD-10-CM) - Vertigo, peripheral, right  THERAPY DIAG:  Dizziness and giddiness  Unsteadiness on feet  BPPV (benign paroxysmal positional vertigo), left  Rationale for Evaluation and Treatment Rehabilitation  SUBJECTIVE:   SUBJECTIVE STATEMENT: Today was kind of rough in the AM. Did her exercises and she feels better. Symptoms are fluctuating and when she has a bad day, it is usually in the morning. Does not believe that it is blood pressure related despite reporting that her BP tends to be low.   Pt accompanied by: self  PERTINENT HISTORY: none   PAIN:  Are you having pain? No  PRECAUTIONS: Fall  PATIENT GOALS: improve dizziness to allow her to go on a trip to [place] in September  OBJECTIVE:      TODAY'S TREATMENT:  09/07/21 Activity Comments  L/R brandt daroff 3x Corrective cueing for positioning; c/o mild dizziness sitting up from R side  L/R brandt daroff EC 2x C/o moderate dizziness to B sides   Romberg horizontal VOR 2x30" Cues to slow down d/t c/o blurred vision; c/o wooziness  Standing on foam wide and 1/2 tandem 30" Mild-mod sway  Wall bumps shoulder with EO and EC ~16x Hesitancy and fear of falling        [facility] PT Assessment - 09/07/21 0001       Standardized Balance Assessment   Standardized Balance Assessment Dynamic Gait Index      Dynamic Gait Index   Level Surface Mild Impairment    Change in Gait Speed Moderate Impairment    Gait with Horizontal Head Turns Normal    Gait with Vertical Head Turns Normal    Gait and Pivot Turn Mild Impairment   c/o slight wooziness   Step Over Obstacle Normal    Step Around Obstacles Mild Impairment    Steps Mild Impairment    Total Score 18              HOME EXERCISE PROGRAM Access Code: [email or web address] URL: [email or web address] Date: 09/07/2021-last update Access Code: [email or web address] URL: [email or web address] Date: 09/07/2021 Prepared by: [facility]  Program  Notes perform standing exercises in a corner for safety  Exercises - Brandt-Daroff Vestibular Exercise  - 1 x daily - 5 x weekly - 2 sets - 3-5 reps - Supine to Right Sidelying Vestibular Habituation  - 1 x daily - 5 x weekly - 2 sets - 3-5 reps - Standing Gaze Stabilization with Head Rotation  - 1 x daily - 5 x weekly - 2-3 sets - 30 sec hold - Standing Balance with Eyes Closed on Foam  - 1 x daily - 5 x weekly - 3 sets - 30 sec hold - Standing Romberg to 1/2 Tandem Stance  - 1 x daily - 5 x weekly - 2-3 sets - 30 sec hold - Walking with Head Rotation  - 1 x daily - 5 x weekly - 1 sets - 3-5 reps - Standing Anterior Posterior Weight Shift  - 1 x daily - 5 x weekly - 2 sets - 10 reps   PATIENT EDUCATION: Education  details: HEP update Person educated: Patient Education method: Explanation, Demonstration, Tactile cues, Verbal cues, and Handouts Education comprehension: verbalized understanding and returned demonstration    Below measures were taken at time of initial evaluation unless otherwise specified:   DIAGNOSTIC FINDINGS: 08/10/21 head CT: No acute intracranial abnormality  COGNITION: Overall cognitive status: Within functional limits for tasks assessed   SENSATION: WFL  POSTURE: rounded shoulders, forward head, increased thoracic kyphosis, and posterior pelvic tilt   GAIT: Gait pattern: decreased step length- Right, decreased step length- Left, and trunk flexed Assistive device utilized: Single point cane Level of assistance: Modified independence  PATIENT SURVEYS:  FOTO [phone]   VESTIBULAR ASSESSMENT   GENERAL OBSERVATION: patient is wearing progressive lenses which she wears "always"    OCULOMOTOR EXAM:   Ocular Alignment: normal   Ocular ROM: No Limitations   Spontaneous Nystagmus: absent   Gaze-Induced Nystagmus: absent   Smooth Pursuits: intact and 1 saccade- normal for age ; c/o mild dizziness   Saccades: intact; c/o mild dizziness     VESTIBULAR - OCULAR REFLEX:    Slow VOR: Normal and Comment: slow and delayed onset dizziness in horizontal direction   VOR Cancellation: Normal; c/o dizziness    Head-Impulse Test: HIT Right: negative HIT Left: negative *C/o dizziness     POSITIONAL TESTING:   *patient with upbeating nystagmus upon laying supine which resolved upon stopping movement  Right Roll Test: negative Left Roll Test: negative Right Dix-Hallpike: negative Left Dix-Hallpike: L upbeating torsional nystagmus lasting ~10 sec 2nd L DH: negative   VESTIBULAR TREATMENT:  Canalith Repositioning:   Epley Left: Number of Reps: 1, Response to Treatment: symptoms improved, and Comment: tolerated well   Access Code: [email or web address] URL:  [email or web address] Date: 08/31/2021 Prepared by: [facility]  Exercises - Brandt-Daroff Vestibular Exercise  - 1 x daily - 5 x weekly - 2 sets - 3-5 reps - Seated Gaze Stabilization with Head Rotation  - 1 x daily - 5 x weekly - 2-3 sets - 30 sec hold - Supine to Right Sidelying Vestibular Habituation  - 1 x daily - 5 x weekly - 2 sets - 3-5 reps   PATIENT EDUCATION: Education details: prognosis, POC, HEP, exam findings Person educated: Patient Education method: Explanation, Demonstration, Tactile cues, Verbal cues, and Handouts Education comprehension: verbalized understanding   GOALS: Goals reviewed with patient? Yes  SHORT TERM GOALS: Target date: 09/13/2021  Patient to be independent with initial HEP. Baseline: HEP initiated Goal status:  MET    LONG TERM GOALS: Target date: 09/27/2021  Patient to be independent with advanced HEP. Baseline: Not yet initiated  Goal status: IN PROGRESS  Patient to report 0/10 dizziness with standing vertical and horizontal VOR for 30 seconds. Baseline: Unable Goal status: IN PROGRESS  Patient will report 0/10 dizziness with bed mobility.  Baseline: Symptomatic  Goal status: IN PROGRESS  Patient to score at least 20/24 on DGI in order to decrease risk of falls. Baseline: NT Goal status: IN PROGRESS    ASSESSMENT:  CLINICAL IMPRESSION: Patient arrived to session with report of fluctuating dizziness, worst in the AM. Patient scored 18/24 of DGI, indicating an increased risk of falls. Most difficulty was evident with increasing gait speed. Review of habituation brought on only slight dizziness from R side, however worse with EC on B sides. Adjusted speed with gaze stabilization exercises to diminish c/o blurred vision. Balance activities with EC are improving, however wall bumps were initiated today and revealed some hesitation. HEP was updated according to exercises that were  well-tolerated today. Patient reported understanding and without complaints at end of session.   OBJECTIVE IMPAIRMENTS Abnormal gait, decreased balance, and dizziness.   ACTIVITY LIMITATIONS carrying, lifting, bending, sleeping, bed mobility, bathing, and locomotion level  PARTICIPATION LIMITATIONS: meal prep, cleaning, laundry, driving, shopping, community activity, yard work, and church  PERSONAL FACTORS Age, Past/current experiences, Time since onset of injury/illness/exacerbation, and Transportation are also affecting patient's functional outcome.   REHAB POTENTIAL: Good  CLINICAL DECISION MAKING: Evolving/moderate complexity  EVALUATION COMPLEXITY: Moderate   PLAN: PT FREQUENCY: 1-2x/week  PT DURATION: 4 weeks  PLANNED INTERVENTIONS: Therapeutic exercises, Therapeutic activity, Neuromuscular re-education, Balance training, Gait training, Patient/Family education, Self Care, Joint mobilization, Stair training, Vestibular training, Canalith repositioning, Dry Needling, Electrical stimulation, Cryotherapy, Moist heat, Taping, Manual therapy, and Re-evaluation  PLAN FOR NEXT SESSION: Continue to progress VOR and habituation, balance on foam and EC; assess updates to HEP and progress as needed.     Janene Harvey, PT, DPT 09/07/21 4:19 PM  [facility] at [facility] [address] [place], [place], [place] [address] Phone # [phone] Fax # [phone]

## 2021-09-07 ENCOUNTER — Encounter: Payer: Self-pay | Admitting: Physical Therapy

## 2021-09-07 ENCOUNTER — Ambulatory Visit: Payer: Medicare PPO | Admitting: Physical Therapy

## 2021-09-07 DIAGNOSIS — H8112 Benign paroxysmal vertigo, left ear: Secondary | ICD-10-CM

## 2021-09-07 DIAGNOSIS — R42 Dizziness and giddiness: Secondary | ICD-10-CM

## 2021-09-07 DIAGNOSIS — R2681 Unsteadiness on feet: Secondary | ICD-10-CM | POA: Diagnosis not present

## 2021-09-13 NOTE — Therapy (Signed)
OUTPATIENT PHYSICAL THERAPY VESTIBULAR TREATMENT     Patient Name: Donna Shaw MRN: [id number] DOB:13-Jan-1955, 66 y.o., female Today's Date: 09/14/2021  PCP: Eulas Post, MD REFERRING PROVIDER: Eulas Post, MD   PT End of Session - 09/14/21 1356     Visit Number 5    Number of Visits 9    Date for PT Re-Evaluation 09/27/21    Authorization Type [employer]    Authorization Time Period 9 visits approved from 08/30/21-09/27/21    Authorization - Visit Number 5    Authorization - Number of Visits 9    PT Start Time 7893    PT Stop Time 1355    PT Time Calculation (min) 41 min    Activity Tolerance Patient tolerated treatment well    Behavior During Therapy WFL for tasks assessed/performed                Past Medical History:  Diagnosis Date   Arthritis    osteoarthritis neck   Chicken pox    Past Surgical History:  Procedure Laterality Date   MOUTH SURGERY     TUBAL LIGATION     Patient Active Problem List   Diagnosis Date Noted   Osteoarthritis of neck 01/22/2012   Raynauds phenomenon 01/22/2012    ONSET DATE: 08/10/21  REFERRING DIAG: H81.391 (ICD-10-CM) - Vertigo, peripheral, right  THERAPY DIAG:  Dizziness and giddiness  Unsteadiness on feet  BPPV (benign paroxysmal positional vertigo), left  Rationale for Evaluation and Treatment Rehabilitation  SUBJECTIVE:   SUBJECTIVE STATEMENT: Had a good weekend and was able to do a lot of yard work with just slight dizziness however on Monday she woke up and felt so dizzy that she did not want to get out of a chair or take a shower. Had a light HA. Felt okay again yesterday. Reports the episode on Monday felt  like "dizzy/lightheaded almost like you've had too much to drink." Reports that she only had 1 migraine in 2014.   Pt accompanied by: self  PERTINENT HISTORY: none   PAIN:  Are you having pain? Yes: NPRS scale: 4/10 Pain location: B temples Pain description:  headache Aggravating factors: laying down and getting up Relieving factors: sitting, keeping still  PRECAUTIONS: Fall  PATIENT GOALS: improve dizziness to allow her to go on a trip to [place] in September  OBJECTIVE:     TODAY'S TREATMENT: 09/14/21 Activity Comments  L roll test Negative; c/o dizziness  R roll test Negative; c/o dizziness  R DH C/o mild dizziness; negative  L DH  Persistent L upbeating torsional nystagmus and dizziness   L sidelying test  Persistent L upbeating torsional nystagmus and dizziness   L semont  Tolerated well  L sidelying test  Persistent L upbeating torsional nystagmus and dizziness   L semont  Tolerated well  L DH Persistent but inconsistent/non-rhythmic L upbeating torsional nystagmus and dizziness   L Epley  Upon R head turn, R upbeating torsional nystagmus and c/o dizziness; tolerated well    PATIENT EDUCATION: Education details: edu on today's findings including differential diagnosis of BPPV vs. Migraine; edu on BPPV anatomy Person educated: Patient Education method: Explanation, Demonstration, Tactile cues, and Verbal cues Education comprehension: verbalized understanding   HOME EXERCISE PROGRAM Access Code: [email or web address] URL: [email or web address] Date: 09/07/2021-last update Access Code: [email or web address] URL: [email or web address] Date: 09/07/2021 Prepared by: [facility]  Program Notes perform standing exercises in a corner for safety  Exercises -  Brandt-Daroff Vestibular Exercise  - 1 x daily - 5 x weekly - 2 sets - 3-5 reps - Supine to Right Sidelying Vestibular Habituation  - 1 x daily - 5 x weekly - 2 sets - 3-5 reps - Standing Gaze Stabilization with Head Rotation  - 1 x daily - 5 x weekly - 2-3 sets - 30 sec hold - Standing Balance with Eyes Closed on Foam  - 1 x daily - 5 x weekly - 3 sets - 30 sec hold - Standing Romberg to 1/2 Tandem Stance  - 1 x daily - 5 x weekly -  2-3 sets - 30 sec hold - Walking with Head Rotation  - 1 x daily - 5 x weekly - 1 sets - 3-5 reps - Standing Anterior Posterior Weight Shift  - 1 x daily - 5 x weekly - 2 sets - 10 reps    Below measures were taken at time of initial evaluation unless otherwise specified:   DIAGNOSTIC FINDINGS: 08/10/21 head CT: No acute intracranial abnormality  COGNITION: Overall cognitive status: Within functional limits for tasks assessed   SENSATION: WFL  POSTURE: rounded shoulders, forward head, increased thoracic kyphosis, and posterior pelvic tilt   GAIT: Gait pattern: decreased step length- Right, decreased step length- Left, and trunk flexed Assistive device utilized: Single point cane Level of assistance: Modified independence  PATIENT SURVEYS:  FOTO [phone]   VESTIBULAR ASSESSMENT   GENERAL OBSERVATION: patient is wearing progressive lenses which she wears "always"    OCULOMOTOR EXAM:   Ocular Alignment: normal   Ocular ROM: No Limitations   Spontaneous Nystagmus: absent   Gaze-Induced Nystagmus: absent   Smooth Pursuits: intact and 1 saccade- normal for age ; c/o mild dizziness   Saccades: intact; c/o mild dizziness     VESTIBULAR - OCULAR REFLEX:    Slow VOR: Normal and Comment: slow and delayed onset dizziness in horizontal direction   VOR Cancellation: Normal; c/o dizziness    Head-Impulse Test: HIT Right: negative HIT Left: negative *C/o dizziness     POSITIONAL TESTING:   *patient with upbeating nystagmus upon laying supine which resolved upon stopping movement  Right Roll Test: negative Left Roll Test: negative Right Dix-Hallpike: negative Left Dix-Hallpike: L upbeating torsional nystagmus lasting ~10 sec 2nd L DH: negative   VESTIBULAR TREATMENT:  Canalith Repositioning:   Epley Left: Number of Reps: 1, Response to Treatment: symptoms improved, and Comment: tolerated well   Access Code: [email or web address] URL: [email or web address] Date:  08/31/2021 Prepared by: [facility]  Exercises - Brandt-Daroff Vestibular Exercise  - 1 x daily - 5 x weekly - 2 sets - 3-5 reps - Seated Gaze Stabilization with Head Rotation  - 1 x daily - 5 x weekly - 2-3 sets - 30 sec hold - Supine to Right Sidelying Vestibular Habituation  - 1 x daily - 5 x weekly - 2 sets - 3-5 reps   PATIENT EDUCATION: Education details: prognosis, POC, HEP, exam findings Person educated: Patient Education method: Explanation, Demonstration, Tactile cues, Verbal cues, and Handouts Education comprehension: verbalized understanding   GOALS: Goals reviewed with patient? Yes  SHORT TERM GOALS: Target date: 09/13/2021  Patient to be independent with initial HEP. Baseline: HEP initiated Goal status: MET    LONG TERM GOALS: Target date: 09/27/2021  Patient to be independent with advanced HEP. Baseline: Not yet initiated  Goal status: IN PROGRESS  Patient to report 0/10 dizziness with standing vertical and horizontal VOR for  30 seconds. Baseline: Unable Goal status: IN PROGRESS  Patient will report 0/10 dizziness with bed mobility.  Baseline: Symptomatic  Goal status: IN PROGRESS  Patient to score at least 20/24 on DGI in order to decrease risk of falls. Baseline: NT Goal status: IN PROGRESS    ASSESSMENT:  CLINICAL IMPRESSION: Patient arrived to session with report of feeling well to the point of tolerating yardwork over the weekend with only mild dizziness. Notes dizziness and poor tolerance of ADLs on Monday, however with return to normal activities yesterday. Positional testing revealed dizziness with all testing and with L DH suggesting L Cupulolithiasis. Treated with L Semont x2 which was well-tolerated but did not resolve symptoms. Upon trial of L Epley patient also with upbeating torsional nystagmus and "lightheadedness" upon R head turn. Patient's nystagmus and pattern of symptoms possibly d/t hx of migraines  mimicking positional vertigo d/t poor response to CRM today. Plan to reassess next session. Patient reported understanding and without complaints at end of session.   OBJECTIVE IMPAIRMENTS Abnormal gait, decreased balance, and dizziness.   ACTIVITY LIMITATIONS carrying, lifting, bending, sleeping, bed mobility, bathing, and locomotion level  PARTICIPATION LIMITATIONS: meal prep, cleaning, laundry, driving, shopping, community activity, yard work, and church  PERSONAL FACTORS Age, Past/current experiences, Time since onset of injury/illness/exacerbation, and Transportation are also affecting patient's functional outcome.   REHAB POTENTIAL: Good  CLINICAL DECISION MAKING: Evolving/moderate complexity  EVALUATION COMPLEXITY: Moderate   PLAN: PT FREQUENCY: 1-2x/week  PT DURATION: 4 weeks  PLANNED INTERVENTIONS: Therapeutic exercises, Therapeutic activity, Neuromuscular re-education, Balance training, Gait training, Patient/Family education, Self Care, Joint mobilization, Stair training, Vestibular training, Canalith repositioning, Dry Needling, Electrical stimulation, Cryotherapy, Moist heat, Taping, Manual therapy, and Re-evaluation  PLAN FOR NEXT SESSION: Continue to progress VOR and habituation, balance on foam and EC; assess updates to HEP and progress as needed.     Janene Harvey, PT, DPT 09/14/21 1:57 PM  [facility] at [facility] [address] [place], [place], [place] [address] Phone # [phone] Fax # [phone]

## 2021-09-14 ENCOUNTER — Ambulatory Visit: Payer: Medicare PPO | Attending: Family Medicine | Admitting: Physical Therapy

## 2021-09-14 ENCOUNTER — Encounter: Payer: Self-pay | Admitting: Physical Therapy

## 2021-09-14 DIAGNOSIS — R42 Dizziness and giddiness: Secondary | ICD-10-CM | POA: Diagnosis not present

## 2021-09-14 DIAGNOSIS — H8112 Benign paroxysmal vertigo, left ear: Secondary | ICD-10-CM | POA: Diagnosis not present

## 2021-09-14 DIAGNOSIS — R2681 Unsteadiness on feet: Secondary | ICD-10-CM | POA: Diagnosis not present

## 2021-09-15 ENCOUNTER — Ambulatory Visit: Payer: Medicare PPO | Admitting: Physical Therapy

## 2021-09-15 ENCOUNTER — Encounter: Payer: Self-pay | Admitting: Physical Therapy

## 2021-09-15 ENCOUNTER — Telehealth: Payer: Self-pay | Admitting: Physical Therapy

## 2021-09-15 ENCOUNTER — Telehealth: Payer: Self-pay | Admitting: Family Medicine

## 2021-09-15 DIAGNOSIS — R42 Dizziness and giddiness: Secondary | ICD-10-CM

## 2021-09-15 DIAGNOSIS — H8112 Benign paroxysmal vertigo, left ear: Secondary | ICD-10-CM

## 2021-09-15 DIAGNOSIS — R2681 Unsteadiness on feet: Secondary | ICD-10-CM

## 2021-09-15 DIAGNOSIS — H814 Vertigo of central origin: Secondary | ICD-10-CM

## 2021-09-15 NOTE — Telephone Encounter (Signed)
Hi Dr. Caryl Never,  I have been seeing Donna Shaw  in [facility] for vertigo. She presented with BPPV which revealed good response to treatment initially, however now reporting spontaneous episodes of vertigo which are no longer responding to vestibular rehab. D/t patient's report of a hx of migraines, she may benefit from further medical work up or referral to neurology. Please advise if any concerns.   Thanks!  Anette Guarneri, PT, DPT 09/15/21 11:57 AM  [facility] at [facility] [address] [place], [address] [place], [place] [address] Phone # [phone] Fax # [phone]

## 2021-09-15 NOTE — Telephone Encounter (Addendum)
Pt called to say PT has informed her that they can no longer help her.  Pt also told her they will recommend that MD furnish Pt with a Neurology Referral.   Pt stated PT will contact MD with recommendation for the referral.

## 2021-09-15 NOTE — Therapy (Addendum)
OUTPATIENT PHYSICAL THERAPY VESTIBULAR TREATMENT     Patient Name: Donna Shaw MRN: [id number] DOB:[birth year], 66 y.o., female Today's Date: 09/15/2021   Progress Note Reporting Period 08/30/21 to 09/15/21  See note below for Objective Data and Assessment of Progress/Goals.      PCP: Eulas Post, MD REFERRING PROVIDER: Eulas Post, MD   PT End of Session - 09/15/21 1150     Visit Number 6    Number of Visits 9    Date for PT Re-Evaluation 09/27/21    Authorization Type [employer]    Authorization Time Period 9 visits approved from 08/30/21-09/27/21    Authorization - Visit Number 6    Authorization - Number of Visits 9    PT Start Time 1020    PT Stop Time 1100    PT Time Calculation (min) 40 min    Activity Tolerance Patient tolerated treatment well    Behavior During Therapy WFL for tasks assessed/performed                 Past Medical History:  Diagnosis Date   Arthritis    osteoarthritis neck   Chicken pox    Past Surgical History:  Procedure Laterality Date   MOUTH SURGERY     TUBAL LIGATION     Patient Active Problem List   Diagnosis Date Noted   Osteoarthritis of neck 01/22/2012   Raynauds phenomenon 01/22/2012    ONSET DATE: 08/10/21  REFERRING DIAG: H81.391 (ICD-10-CM) - Vertigo, peripheral, right  THERAPY DIAG:  Dizziness and giddiness  Unsteadiness on feet  BPPV (benign paroxysmal positional vertigo), left  Rationale for Evaluation and Treatment Rehabilitation  SUBJECTIVE:   SUBJECTIVE STATEMENT: Started to have increased dizziness at 10pm last night when she was laying on R side and had sensation of constant spinning. Had difficulty getting to the bathroom without her husband's help. Did not sleep well and has a HA. Has tried doing the epley to the R side with her husband.   Pt accompanied by: self  PERTINENT HISTORY: none   PAIN:  Are you having pain? Yes: NPRS scale: 3/10 Pain location: B  temples Pain description: headache Aggravating factors: laying down and getting up Relieving factors: sitting, keeping still  PRECAUTIONS: Fall  PATIENT GOALS: improve dizziness to allow her to go on a trip to [place] in September  OBJECTIVE:     TODAY'S TREATMENT: 09/15/21 Activity Comments  R roll test negative  L roll test A few beats of  upbeating torsional nystagmus; no dizziness   R DH 1 beat of delayed upbeating nystagmus; mild dizziness; intense dizziness upon sitting up without nystagmus  L DH L upbeating torsional nystagmus and dizziness lasting ~1 min  L epley  Tolerated well; upon R head turn evident R downbeating torsional nystagmus lasting ~40 sec  L DH Persistent L upbeating torsional nystagmus with fluctuating dizziness but less severe until sitting upright when dizziness increased   L sidelying test  Persisting L upbeating torsional nystagmus   L semont Tolerated well; dizziness upon coming up to sit    PATIENT EDUCATION: Education details: HEP update including L self-epley and semont and recommendation to see neurology if symptoms don't improve Person educated: Patient Education method: Explanation, Demonstration, Tactile cues, Verbal cues, and Handouts Education comprehension: verbalized understanding   HOME EXERCISE PROGRAM Access Code: [email or web address] URL: [email or web address] Date: 09/15/2021-last update Access Code: [email or web address] URL: [email or web address] Date: 09/15/2021 Prepared by: [facility]  Program  Notes perform standing exercises in a corner for safety  Exercises - Brandt-Daroff Vestibular Exercise  - 1 x daily - 5 x weekly - 2 sets - 3-5 reps - Supine to Right Sidelying Vestibular Habituation  - 1 x daily - 5 x weekly - 2 sets - 3-5 reps - Standing Gaze Stabilization with Head Rotation  - 1 x daily - 5 x weekly - 2-3 sets - 30 sec hold - Standing Balance with Eyes Closed on Foam  - 1 x  daily - 5 x weekly - 3 sets - 30 sec hold - Standing Romberg to 1/2 Tandem Stance  - 1 x daily - 5 x weekly - 2-3 sets - 30 sec hold - Walking with Head Rotation  - 1 x daily - 5 x weekly - 1 sets - 3-5 reps - Standing Anterior Posterior Weight Shift  - 1 x daily - 5 x weekly - 2 sets - 10 reps - Self-Epley Maneuver Left Ear  - 1 x daily - 5 x weekly - 2 sets - 10 reps    Below measures were taken at time of initial evaluation unless otherwise specified:   DIAGNOSTIC FINDINGS: 08/10/21 head CT: No acute intracranial abnormality  COGNITION: Overall cognitive status: Within functional limits for tasks assessed   SENSATION: WFL  POSTURE: rounded shoulders, forward head, increased thoracic kyphosis, and posterior pelvic tilt   GAIT: Gait pattern: decreased step length- Right, decreased step length- Left, and trunk flexed Assistive device utilized: Single point cane Level of assistance: Modified independence  PATIENT SURVEYS:  FOTO [phone]   VESTIBULAR ASSESSMENT   GENERAL OBSERVATION: patient is wearing progressive lenses which she wears "always"    OCULOMOTOR EXAM:   Ocular Alignment: normal   Ocular ROM: No Limitations   Spontaneous Nystagmus: absent   Gaze-Induced Nystagmus: absent   Smooth Pursuits: intact and 1 saccade- normal for age ; c/o mild dizziness   Saccades: intact; c/o mild dizziness     VESTIBULAR - OCULAR REFLEX:    Slow VOR: Normal and Comment: slow and delayed onset dizziness in horizontal direction   VOR Cancellation: Normal; c/o dizziness    Head-Impulse Test: HIT Right: negative HIT Left: negative *C/o dizziness     POSITIONAL TESTING:   *patient with upbeating nystagmus upon laying supine which resolved upon stopping movement  Right Roll Test: negative Left Roll Test: negative Right Dix-Hallpike: negative Left Dix-Hallpike: L upbeating torsional nystagmus lasting ~10 sec 2nd L DH: negative   VESTIBULAR TREATMENT:  Canalith  Repositioning:   Epley Left: Number of Reps: 1, Response to Treatment: symptoms improved, and Comment: tolerated well   Access Code: [email or web address] URL: [email or web address] Date: 08/31/2021 Prepared by: [facility]  Exercises - Brandt-Daroff Vestibular Exercise  - 1 x daily - 5 x weekly - 2 sets - 3-5 reps - Seated Gaze Stabilization with Head Rotation  - 1 x daily - 5 x weekly - 2-3 sets - 30 sec hold - Supine to Right Sidelying Vestibular Habituation  - 1 x daily - 5 x weekly - 2 sets - 3-5 reps   PATIENT EDUCATION: Education details: prognosis, POC, HEP, exam findings Person educated: Patient Education method: Explanation, Demonstration, Tactile cues, Verbal cues, and Handouts Education comprehension: verbalized understanding   GOALS: Goals reviewed with patient? Yes  SHORT TERM GOALS: Target date: 09/13/2021  Patient to be independent with initial HEP. Baseline: HEP initiated Goal status: MET    LONG TERM GOALS: Target  date: 09/27/2021  Patient to be independent with advanced HEP. Baseline: Not yet initiated  Goal status: IN PROGRESS  Patient to report 0/10 dizziness with standing vertical and horizontal VOR for 30 seconds. Baseline: Unable Goal status: IN PROGRESS  Patient will report 0/10 dizziness with bed mobility.  Baseline: Symptomatic  Goal status: IN PROGRESS  Patient to score at least 20/24 on DGI in order to decrease risk of falls. Baseline: NT Goal status: IN PROGRESS    ASSESSMENT:  CLINICAL IMPRESSION: Patient arrived to session with husband's assist d/t imbalance. Reports episode of dizziness and imbalance last night when laying on her R side. No red flag symptoms. Positional testing was positive for L canalithiasis. Tolerated L Epley well, however re-test showed persisting nystagmus which was treated with L Semont. Patient reported some remaining dizziness at end of session. However, appeared  slightly steadier upon leaving session today. Discussed next level of care which at this point would probably be Neurology, as patient is demonstrating limited response to [place] today. Patient reported understanding of all edu provided and without complaints at end of session.    OBJECTIVE IMPAIRMENTS Abnormal gait, decreased balance, and dizziness.   ACTIVITY LIMITATIONS carrying, lifting, bending, sleeping, bed mobility, bathing, and locomotion level  PARTICIPATION LIMITATIONS: meal prep, cleaning, laundry, driving, shopping, community activity, yard work, and church  PERSONAL FACTORS Age, Past/current experiences, Time since onset of injury/illness/exacerbation, and Transportation are also affecting patient's functional outcome.   REHAB POTENTIAL: Good  CLINICAL DECISION MAKING: Evolving/moderate complexity  EVALUATION COMPLEXITY: Moderate   PLAN: PT FREQUENCY: 1-2x/week  PT DURATION: 4 weeks  PLANNED INTERVENTIONS: Therapeutic exercises, Therapeutic activity, Neuromuscular re-education, Balance training, Gait training, Patient/Family education, Self Care, Joint mobilization, Stair training, Vestibular training, Canalith repositioning, Dry Needling, Electrical stimulation, Cryotherapy, Moist heat, Taping, Manual therapy, and Re-evaluation  PLAN FOR NEXT SESSION: treat with CRM as needed    Janene Harvey, PT, DPT 09/15/21 11:52 AM  [place] [facility] [address], [place], [place] [address] Phone # [phone] Fax # [phone]   PHYSICAL THERAPY DISCHARGE SUMMARY  Visits from Start of Care: 6  Current functional level related to goals / functional outcomes: Unable to assess; patient did not return   Remaining deficits: Unable to assess   Education / Equipment: HEP  Plan: Patient agrees to discharge.  Patient goals were not met. Patient is being discharged due to not returning.     Janene Harvey, PT,  DPT 11/30/21 3:54 PM  [facility] at [facility] [address] [place], [place], [place] [address] Phone # [phone] Fax # [phone]

## 2021-09-16 NOTE — Telephone Encounter (Signed)
Referral placed and patient aware 

## 2021-10-03 ENCOUNTER — Encounter: Payer: Self-pay | Admitting: Family Medicine

## 2021-10-03 ENCOUNTER — Telehealth (INDEPENDENT_AMBULATORY_CARE_PROVIDER_SITE_OTHER): Payer: Medicare PPO | Admitting: Family Medicine

## 2021-10-03 VITALS — Temp 97.6°F | Ht 63.0 in | Wt 165.0 lb

## 2021-10-03 DIAGNOSIS — U071 COVID-19: Secondary | ICD-10-CM

## 2021-10-03 MED ORDER — MOLNUPIRAVIR EUA 200MG CAPSULE: 4.0000 | ORAL_CAPSULE | Freq: Two times a day (BID) | ORAL | 0 refills | Status: AC

## 2021-10-03 NOTE — Telephone Encounter (Signed)
Patient added to schedule.

## 2021-10-03 NOTE — Progress Notes (Signed)
Patient ID: CARLISLE TORGESON, female   DOB: Apr 20, 1955, 66 y.o.   MRN: [id number]   Virtual Visit via Telephone Note  I connected with Redmond Pulling on 10/03/21 at  3:00 PM EDT by telephone and verified that I am speaking with the correct person using two identifiers.   I discussed the limitations, risks, security and privacy concerns of performing an evaluation and management service by telephone and the availability of in person appointments. I also discussed with the patient that there may be a patient responsible charge related to this service. The patient expressed understanding and agreed to proceed.  Location patient: home Location provider: work or home office Participants present for the call: patient, provider Patient did not have a visit in the prior 7 days to address this/these issue(s).   History of Present Illness:  Judah has [place].  She and her husband just got back from [facility] Saturday.  She initially thought she was having fatigue secondary to travel.  She developed some sneezing, congestion, cough, and earache.  No fever.  Positive COVID test yesterday.  Only very mild dyspnea.  Denies any nausea, vomiting, or diarrhea.  She had COVID a year ago at this time and took Paxlovid but had some side effects.  She would like to consider molnupiravir.  Past Medical History:  Diagnosis Date   Arthritis    osteoarthritis neck   Chicken pox    Past Surgical History:  Procedure Laterality Date   MOUTH SURGERY     TUBAL LIGATION      reports that she has never smoked. She has never used smokeless tobacco. She reports current alcohol use of about 1.0 standard drink of alcohol per week. She reports that she does not use drugs. family history includes Alcohol abuse in her brother; Cancer in her father and mother; Diabetes in her brother. Allergies  Allergen Reactions   Codeine     hives   Sulfamethoxazole-Tmp Ds [Sulfamethoxazole-Trimethoprim]     Pt is able to take medication  however, she would prefer NOT to due to medication making her feel nauseated.    Observations/Objective: Patient sounds cheerful and well on the phone. I do not appreciate any SOB. Speech and thought processing are grossly intact. Patient reported vitals:  Assessment and Plan:  COVID infection.  We discussed antivirals and she would like to get started on that.  We will start molnupiravir 4 capsules by mouth twice daily for 5 days.  She is well within the 5-day window to start treatment.  Plenty fluids and rest.  Monitor O2 sats if possible.  Follow-up for any increased dyspnea or other concerns. She is aware of isolation guidelines  Follow Up Instructions:   99441 5-10 99442 11-20 99443 21-30 I did not refer this patient for an OV in the next 24 hours for this/these issue(s).  I discussed the assessment and treatment plan with the patient. The patient was provided an opportunity to ask questions and all were answered. The patient agreed with the plan and demonstrated an understanding of the instructions.   The patient was advised to call back or seek an in-person evaluation if the symptoms worsen or if the condition fails to improve as anticipated.  I provided 14 minutes of non-face-to-face time during this encounter.   Carolann Littler, MD

## 2021-10-10 ENCOUNTER — Encounter: Payer: Self-pay | Admitting: Family Medicine

## 2021-10-26 ENCOUNTER — Ambulatory Visit: Payer: Medicare PPO | Admitting: Neurology

## 2021-10-26 ENCOUNTER — Encounter: Payer: Self-pay | Admitting: Neurology

## 2021-10-26 VITALS — BP 127/76 | HR 89 | Ht 63.0 in | Wt 176.1 lb

## 2021-10-26 DIAGNOSIS — R42 Dizziness and giddiness: Secondary | ICD-10-CM | POA: Diagnosis not present

## 2021-10-26 DIAGNOSIS — H8110 Benign paroxysmal vertigo, unspecified ear: Secondary | ICD-10-CM | POA: Diagnosis not present

## 2021-10-26 NOTE — Progress Notes (Signed)
[facility]  PATIENT: Donna Shaw DOB: 10-10-1955  REQUESTING CLINICIAN: Eulas Post, MD HISTORY FROM: Patient and husband  REASON FOR VISIT: Dizziness    HISTORICAL  CHIEF COMPLAINT:  Chief Complaint  Patient presents with   New Patient (Initial Visit)    Rm 15 with husband. Reports she is here to discuss intermittent vertigo like sx. Pt reports sx started on 8/2 (first event). Denies any falls related to this symptoms.     HISTORY OF PRESENT ILLNESS:  This is a 66 year old woman past medical history of asthma who is presenting with ongoing dizziness.  Patient reports that she woke up in the morning of August 2 with severe dizziness that she described as room spinning, with inability to walk.  She presented to the ED, her head CT was negative for any acute abnormality and she was diagnosed with vertigo. She was initially treated with meclizine but stated the medication was not helpful.  She was also report recommended PT. She completed PT, acutely was discharged and was able to go to [place] for vacation.  She stated that she was able to able to walk but with a walker.  Since then, she has bouts of dizziness, that she described mainly as lightheadedness, feeling off balance, feels like she is drunk and associated with slight headache.  The episodes can last from 15 minutes up to all day.  She has not started physical therapy. Prior to August 2, she has not had any dizziness or dizzy spell.    OTHER MEDICAL CONDITIONS: Asthma    REVIEW OF SYSTEMS: Full 14 system review of systems performed and negative with exception of: As noted in the HPI   ALLERGIES: Allergies  Allergen Reactions   Codeine     hives   Sulfamethoxazole-Tmp Ds [Sulfamethoxazole-Trimethoprim]     Pt is able to take medication however, she would prefer NOT to due to medication making her feel nauseated.    HOME MEDICATIONS: Outpatient Medications Prior to Visit  Medication Sig  Dispense Refill   albuterol (VENTOLIN HFA) 108 (90 Base) MCG/ACT inhaler TAKE 2 PUFFS BY MOUTH EVERY 6 HOURS AS NEEDED FOR WHEEZE OR SHORTNESS OF BREATH 8.5 each 1   estradiol (CLIMARA - DOSED IN MG/24 HR) 0.025 mg/24hr patch 0.025 mg once a week.     meclizine (ANTIVERT) 25 MG tablet Take 1 tablet (25 mg total) by mouth 3 (three) times daily as needed for dizziness. 30 tablet 0   progesterone (PROMETRIUM) 100 MG capsule Take 100 mg by mouth daily.      Ranitidine HCl (ZANTAC PO) Take by mouth. As needed     No facility-administered medications prior to visit.    PAST MEDICAL HISTORY: Past Medical History:  Diagnosis Date   Arthritis    osteoarthritis neck   Chicken pox     PAST SURGICAL HISTORY: Past Surgical History:  Procedure Laterality Date   MOUTH SURGERY     TUBAL LIGATION      FAMILY HISTORY: Family History  Problem Relation Age of Onset   Cancer Mother        lung   Cancer Father        colon   Alcohol abuse Brother    Diabetes Brother     SOCIAL HISTORY: Social History   Socioeconomic History   Marital status: Married    Spouse name: Not on file   Number of children: Not on file   Years of education: Not on file  Highest education level: Not on file  Occupational History   Not on file  Tobacco Use   Smoking status: Never   Smokeless tobacco: Never  Vaping Use   Vaping Use: Never used  Substance and Sexual Activity   Alcohol use: Yes    Alcohol/week: 1.0 standard drink of alcohol    Types: 1 Glasses of wine per week    Comment: Occasionally   Drug use: Never   Sexual activity: Not on file  Other Topics Concern   Not on file  Social History Narrative   Right handed    Caffeine- 2 cups per day    Social Determinants of Health   Financial Resource Strain: Not on file  Food Insecurity: Not on file  Transportation Needs: Not on file  Physical Activity: Not on file  Stress: Not on file  Social Connections: Not on file  Intimate Partner  Violence: Not on file    PHYSICAL EXAM  GENERAL EXAM/CONSTITUTIONAL: Vitals:  Vitals:   10/26/21 1410  BP: 127/76  Pulse: 89  Weight: 176 lb 2 oz (79.9 kg)  Height: 5\' 3"  (1.6 m)   Body mass index is 31.2 kg/m. Wt Readings from Last 3 Encounters:  10/26/21 176 lb 2 oz (79.9 kg)  10/03/21 165 lb (74.8 kg)  08/17/21 176 lb 4.8 oz (80 kg)   Patient is in no distress; well developed, nourished and groomed; neck is supple  EYES: Pupils round and reactive to light, Visual fields full to confrontation, Extraocular movements intacts,   MUSCULOSKELETAL: Gait, strength, tone, movements noted in Neurologic exam below  NEUROLOGIC: MENTAL STATUS:      No data to display         awake, alert, oriented to person, place and time recent and remote memory intact normal attention and concentration language fluent, comprehension intact, naming intact fund of knowledge appropriate  CRANIAL NERVE:  2nd, 3rd, 4th, 6th - pupils equal and reactive to light, visual fields full to confrontation, extraocular muscles intact, no nystagmus 5th - facial sensation symmetric 7th - facial strength symmetric 8th - hearing intact 9th - palate elevates symmetrically, uvula midline 11th - shoulder shrug symmetric 12th - tongue protrusion midline  MOTOR:  normal bulk and tone, full strength in the BUE, BLE  SENSORY:  normal and symmetric to light touch  COORDINATION:  finger-nose-finger, fine finger movements normal  REFLEXES:  deep tendon reflexes present and symmetric  GAIT/STATION:  Unsteady, unable to tandem and there is positive Romberg     DIAGNOSTIC DATA (LABS, IMAGING, TESTING) - I reviewed patient records, labs, notes, testing and imaging myself where available.  Lab Results  Component Value Date   WBC 7.4 08/10/2021   HGB 14.6 08/10/2021   HCT 43.9 08/10/2021   MCV 93.4 08/10/2021   PLT 421 (H) 08/10/2021      Component Value Date/Time   NA 142 08/10/2021 1050   K  3.6 08/10/2021 1050   CL 102 08/10/2021 1050   CO2 25 08/10/2021 1050   GLUCOSE 112 (H) 08/10/2021 1050   BUN 13 08/10/2021 1050   CREATININE 1.00 08/10/2021 1050   CREATININE 0.91 08/12/2019 0955   CALCIUM 10.1 08/10/2021 1050   PROT 7.3 08/10/2021 1050   ALBUMIN 4.7 08/10/2021 1050   AST 19 08/10/2021 1050   ALT 11 08/10/2021 1050   ALKPHOS 75 08/10/2021 1050   BILITOT 0.5 08/10/2021 1050   GFRNONAA >60 08/10/2021 1050   Lab Results  Component Value Date   CHOL  192 02/08/2021   HDL 51.00 02/08/2021   LDLCALC 120 (H) 02/08/2021   TRIG 104.0 02/08/2021   CHOLHDL 4 02/08/2021   No results found for: "HGBA1C" No results found for: "VITAMINB12" Lab Results  Component Value Date   TSH 2.89 02/08/2021    Head CT 08/10/2021 No acute intracranial abnormality    ASSESSMENT AND PLAN  66 y.o. year old female with history of asthma, who is presenting with dizziness since August 2.  She completed physical therapy, but still have episodes of lightheadedness, feeling unbalanced, feeling like she is drunk, and these episodes can be associated with slight headaches.  Due to ongoing symptoms, I will obtain MRI of the brain but if negative and she still symptomatic then she should consider ENT follow-up. This was discussed with patient and she voices understanding.    1. Benign paroxysmal vertigo, unspecified laterality   2. Episodic lightheadedness      Patient Instructions  MRI Brain with and without contrast  Continue with Meclizine as needed  If MRI Brain normal and you are still symptomatic, consider ENT referral  Follow up as needed   Orders Placed This Encounter  Procedures   MR BRAIN W WO CONTRAST    No orders of the defined types were placed in this encounter.   Return if symptoms worsen or fail to improve.  I have spent a total of 45 minutes dedicated to this patient today, preparing to see patient, performing a medically appropriate examination and evaluation,  ordering tests and/or medications and procedures, and counseling and educating the patient/family/caregiver; independently interpreting result and communicating results to the family/patient/caregiver; and documenting clinical information in the electronic medical record.   Alric Ran, MD 10/26/2021, 4:32 PM  [facility] [address], [place], [place] [address] [phone]

## 2021-10-26 NOTE — Patient Instructions (Signed)
MRI Brain with and without contrast  Continue with Meclizine as needed  If MRI Brain normal and you are still symptomatic, consider ENT referral  Follow up as needed

## 2021-10-27 ENCOUNTER — Telehealth: Payer: Self-pay | Admitting: Neurology

## 2021-10-27 NOTE — Telephone Encounter (Signed)
Donna Shaw: [id number] exp. 10/27/21-11/26/21 sent to GI [phone]

## 2021-10-31 DIAGNOSIS — H5213 Myopia, bilateral: Secondary | ICD-10-CM | POA: Diagnosis not present

## 2021-10-31 DIAGNOSIS — H524 Presbyopia: Secondary | ICD-10-CM | POA: Diagnosis not present

## 2021-10-31 DIAGNOSIS — H52223 Regular astigmatism, bilateral: Secondary | ICD-10-CM | POA: Diagnosis not present

## 2021-11-14 ENCOUNTER — Ambulatory Visit
Admission: RE | Admit: 2021-11-14 | Discharge: 2021-11-14 | Disposition: A | Discharge status: Home / Self Care | Payer: Medicare PPO | Source: Ambulatory Visit | Attending: Neurology | Admitting: Neurology

## 2021-11-14 DIAGNOSIS — H8110 Benign paroxysmal vertigo, unspecified ear: Secondary | ICD-10-CM

## 2021-11-14 MED ORDER — GADOPICLENOL 0.5 MMOL/ML IV SOLN
7.5000 mL | Freq: Once | INTRAVENOUS | Status: AC | PRN
  Administered 2021-11-14: 7.5 mL via INTRAVENOUS

## 2021-11-17 ENCOUNTER — Encounter: Payer: Self-pay | Admitting: Neurology

## 2022-01-04 ENCOUNTER — Other Ambulatory Visit: Payer: Self-pay | Admitting: Family Medicine

## 2022-01-04 MED ORDER — MECLIZINE HCL 25 MG PO TABS: 25.0000 mg | ORAL_TABLET | Freq: Three times a day (TID) | ORAL | 0 refills | Status: DC | PRN

## 2022-01-19 DIAGNOSIS — Z6831 Body mass index (BMI) 31.0-31.9, adult: Secondary | ICD-10-CM | POA: Diagnosis not present

## 2022-01-19 DIAGNOSIS — Z124 Encounter for screening for malignant neoplasm of cervix: Secondary | ICD-10-CM | POA: Diagnosis not present

## 2022-01-19 DIAGNOSIS — Z01419 Encounter for gynecological examination (general) (routine) without abnormal findings: Secondary | ICD-10-CM | POA: Diagnosis not present

## 2022-01-19 DIAGNOSIS — M8580 Other specified disorders of bone density and structure, unspecified site: Secondary | ICD-10-CM | POA: Diagnosis not present

## 2022-01-19 DIAGNOSIS — N951 Menopausal and female climacteric states: Secondary | ICD-10-CM | POA: Diagnosis not present

## 2022-01-19 DIAGNOSIS — Z01411 Encounter for gynecological examination (general) (routine) with abnormal findings: Secondary | ICD-10-CM | POA: Diagnosis not present

## 2022-01-20 ENCOUNTER — Other Ambulatory Visit: Payer: Self-pay | Admitting: Obstetrics

## 2022-01-20 DIAGNOSIS — M8580 Other specified disorders of bone density and structure, unspecified site: Secondary | ICD-10-CM

## 2022-03-13 ENCOUNTER — Other Ambulatory Visit: Payer: Self-pay | Admitting: Obstetrics

## 2022-03-13 DIAGNOSIS — Z1231 Encounter for screening mammogram for malignant neoplasm of breast: Secondary | ICD-10-CM

## 2022-05-01 ENCOUNTER — Ambulatory Visit: Payer: Medicare PPO

## 2022-06-03 ENCOUNTER — Encounter: Payer: Self-pay | Admitting: Family Medicine

## 2022-06-06 ENCOUNTER — Ambulatory Visit
Admission: RE | Admit: 2022-06-06 | Discharge: 2022-06-06 | Disposition: A | Discharge status: Home / Self Care | Payer: Medicare PPO | Source: Ambulatory Visit | Attending: Obstetrics | Admitting: Obstetrics

## 2022-06-06 DIAGNOSIS — Z1231 Encounter for screening mammogram for malignant neoplasm of breast: Secondary | ICD-10-CM | POA: Diagnosis not present

## 2022-06-22 IMAGING — MG MM DIGITAL DIAGNOSTIC UNILAT*L* W/ TOMO W/ CAD
7 series · 8 of 15 positions shown · non-contrast
Comparison: Previous exam(s).

CLINICAL DATA: Short-term follow-up for probably benign left breast
calcifications, initially assessed with diagnostic imaging on
03/22/2020.

EXAM:
DIGITAL DIAGNOSTIC UNILATERAL LEFT MAMMOGRAM WITH TOMOSYNTHESIS AND
CAD
TECHNIQUE: Left digital diagnostic mammography and breast tomosynthesis was
performed. The images were evaluated with computer-aided detection.

[L ML]
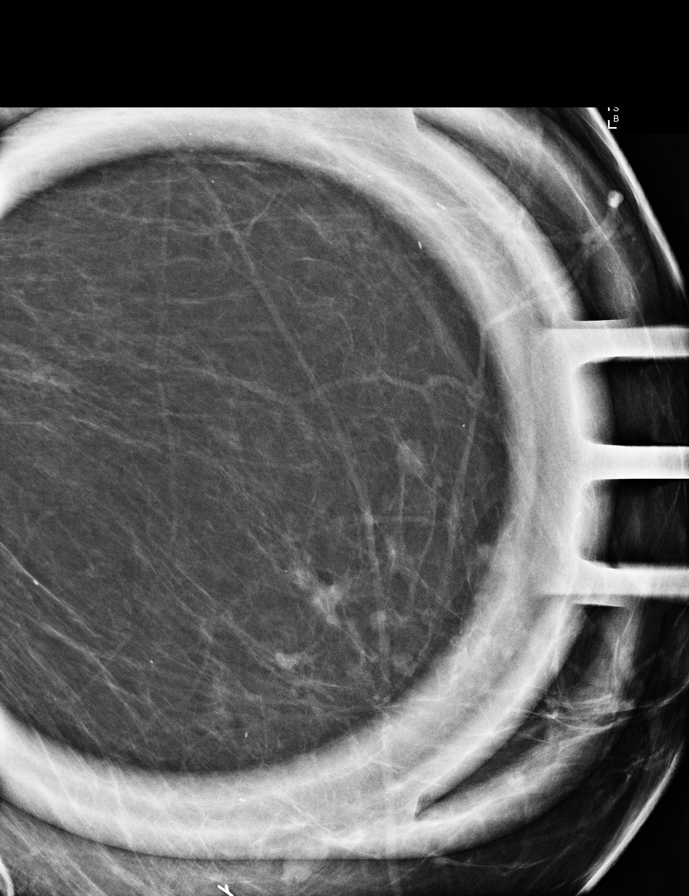

[L CC (1 of 2)]
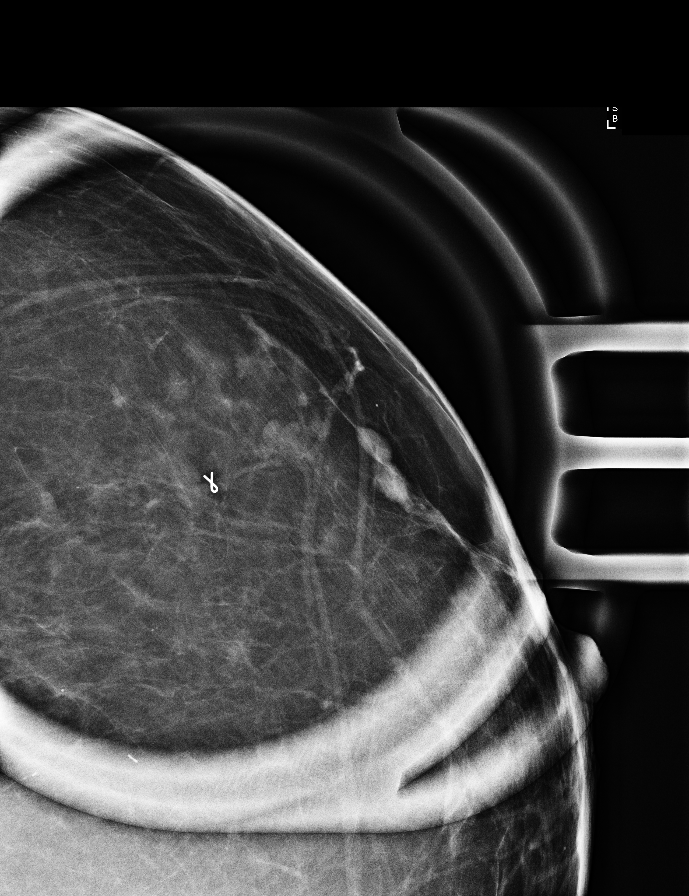

[L CC (2 of 2)]
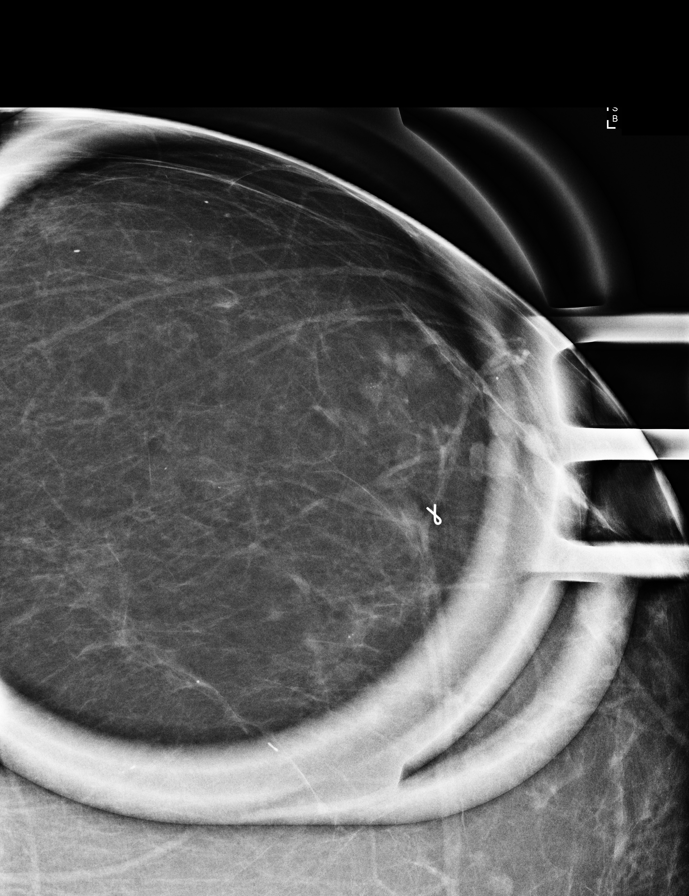

[L MLO synth-2D]
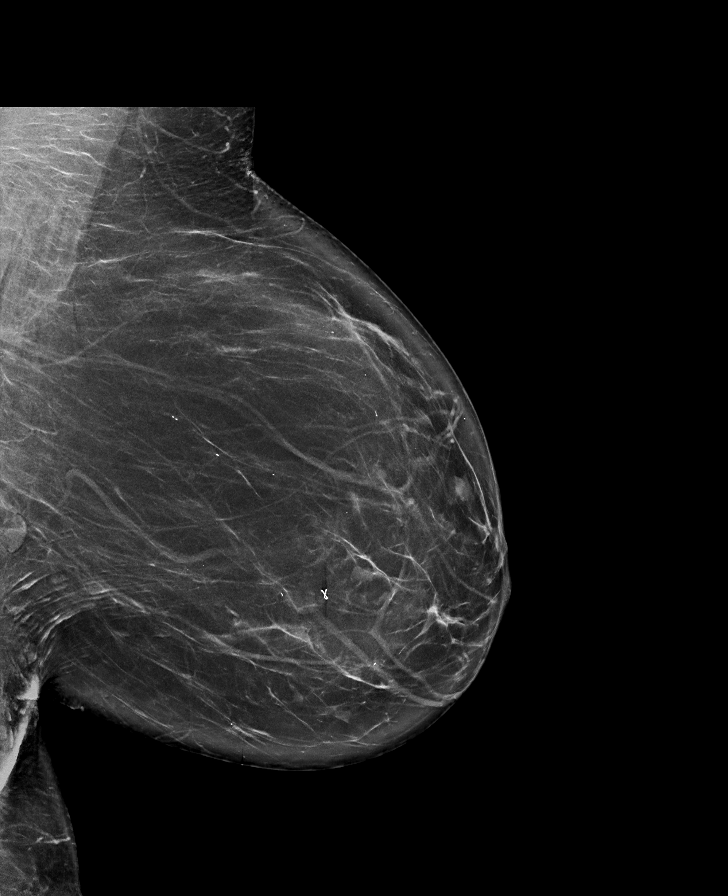

[L CC synth-2D]
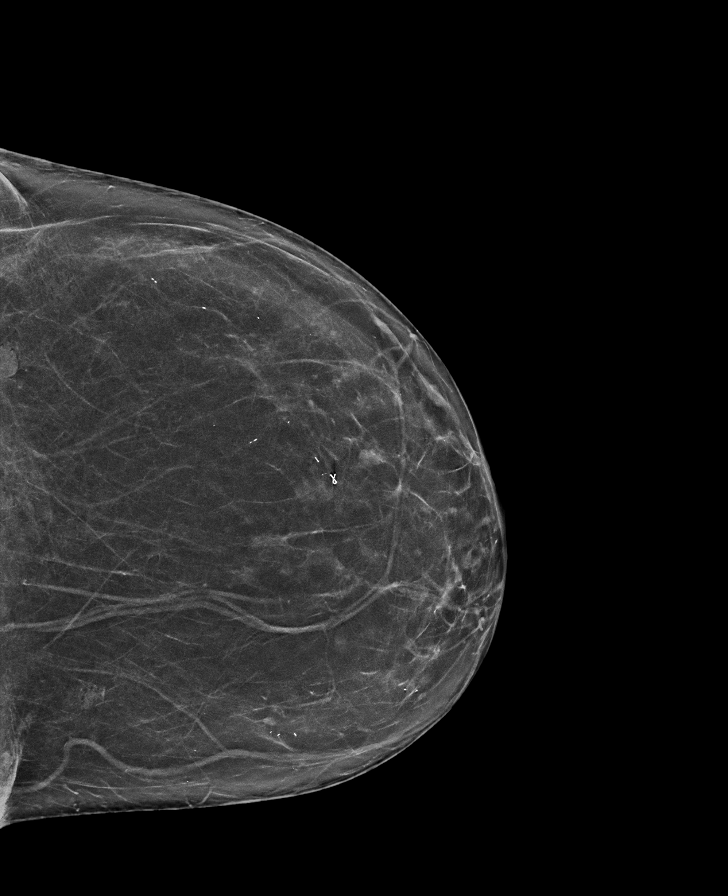

[L MLO tomo · 2 of 81 frames shown]
[frame 27/81]
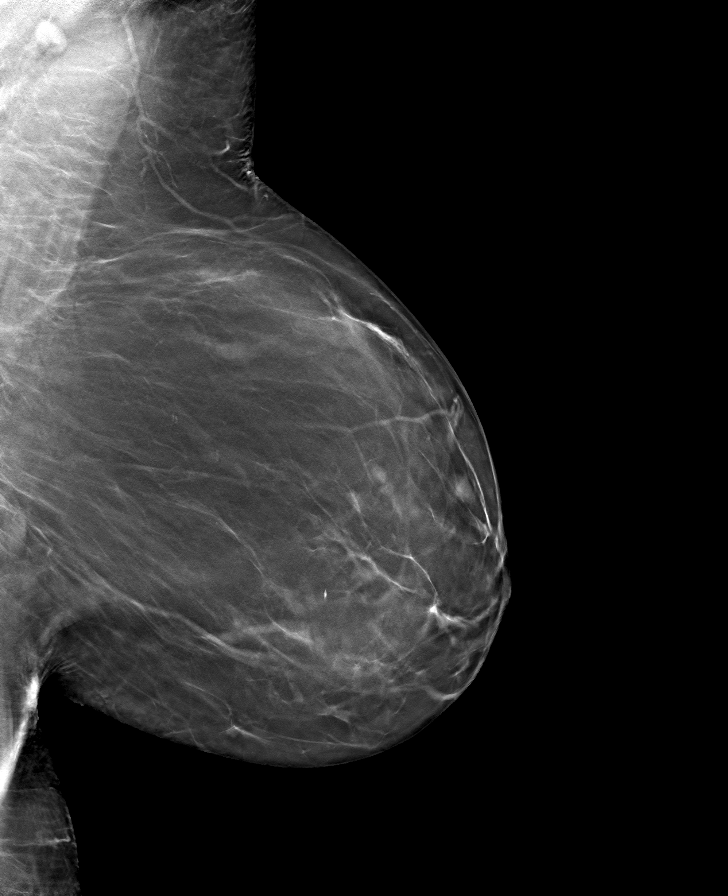
[frame 41/81]
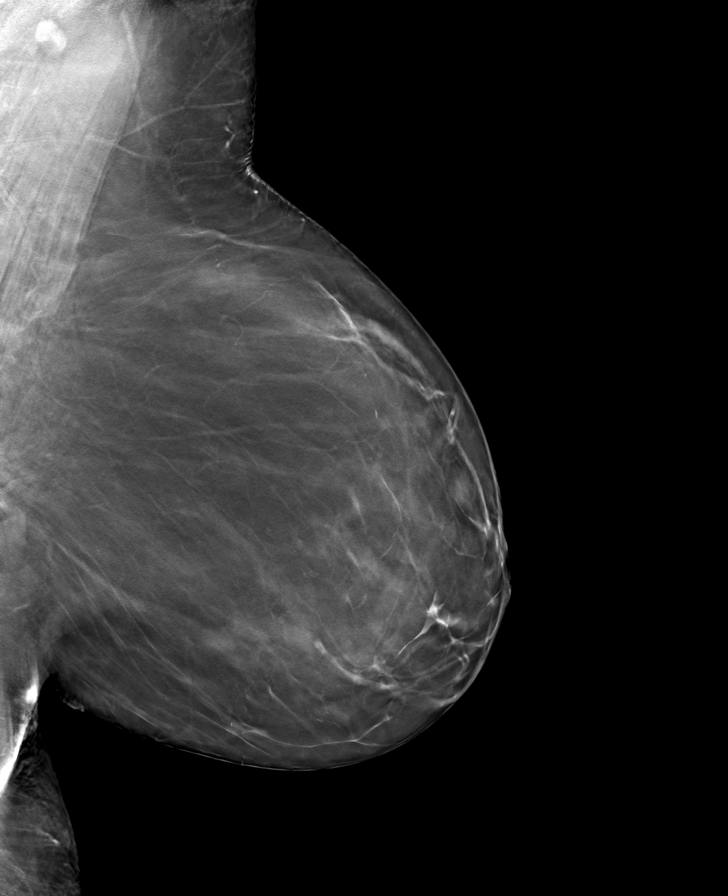

[L CC tomo · tomo slice 34/67.0]
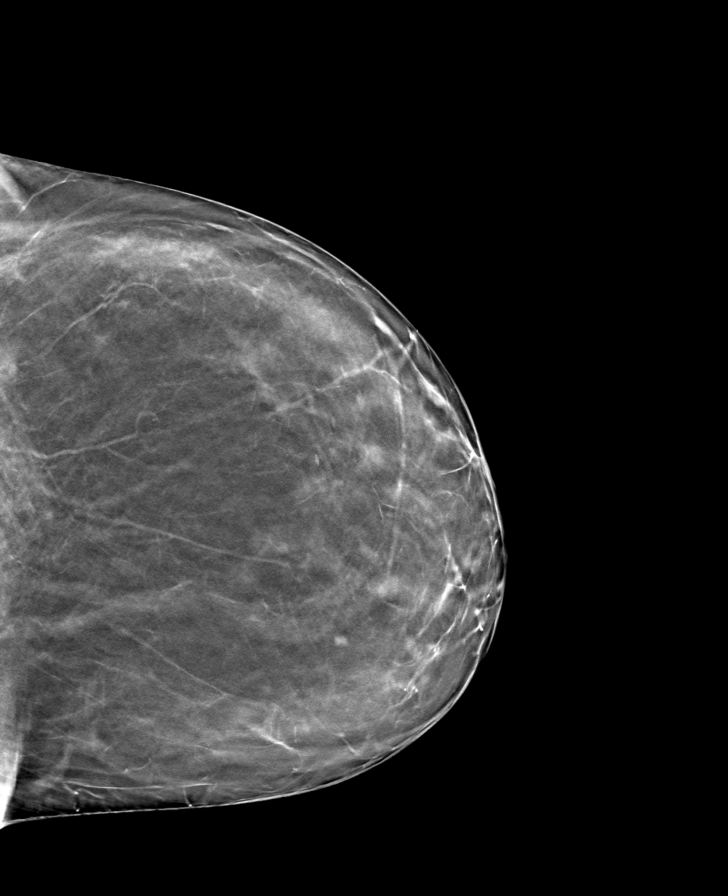

[8 of 15 positions shown; findings below may reference images not displayed]

ACR Breast Density Category b: There are scattered areas of
fibroglandular density.
FINDINGS: The small group of calcifications in the lateral left breast are
stable. There are no new calcifications. There are no suspicious
masses or areas of architectural distortion.
IMPRESSION: 1. Probably benign small group of calcifications in the lateral left
breast, stable for 6 months. Additional short-term follow-up
recommended.

RECOMMENDATION:
Diagnostic mammography with left breast magnification views in 6
months.

I have discussed the findings and recommendations with the patient.
If applicable, a reminder letter will be sent to the patient
regarding the next appointment.

BI-RADS CATEGORY  3: Probably benign.

## 2022-08-04 ENCOUNTER — Encounter: Payer: Medicare PPO | Admitting: Family Medicine

## 2022-08-09 ENCOUNTER — Encounter (INDEPENDENT_AMBULATORY_CARE_PROVIDER_SITE_OTHER): Payer: Self-pay

## 2022-08-25 ENCOUNTER — Encounter: Payer: Self-pay | Admitting: Family Medicine

## 2022-08-25 ENCOUNTER — Ambulatory Visit (INDEPENDENT_AMBULATORY_CARE_PROVIDER_SITE_OTHER): Payer: Medicare PPO | Admitting: Family Medicine

## 2022-08-25 VITALS — BP 100/60 | HR 76 | Temp 98.0°F | Ht 62.99 in | Wt 180.8 lb

## 2022-08-25 DIAGNOSIS — Z0000 Encounter for general adult medical examination without abnormal findings: Secondary | ICD-10-CM

## 2022-08-25 LAB — CBC WITH DIFFERENTIAL/PLATELET
Basophils Absolute: 0.1 10*3/uL (ref 0.0–0.1)
Basophils Relative: 2.3 % (ref 0.0–3.0)
Eosinophils Absolute: 0.2 10*3/uL (ref 0.0–0.7)
Eosinophils Relative: 3.3 % (ref 0.0–5.0)
HCT: 42 % (ref 36.0–46.0)
Hemoglobin: 13.7 g/dL (ref 12.0–15.0)
Lymphocytes Relative: 29.6 % (ref 12.0–46.0)
Lymphs Abs: 1.7 10*3/uL (ref 0.7–4.0)
MCHC: 32.6 g/dL (ref 30.0–36.0)
MCV: 95 fl (ref 78.0–100.0)
Monocytes Absolute: 0.6 10*3/uL (ref 0.1–1.0)
Monocytes Relative: 11 % (ref 3.0–12.0)
Neutro Abs: 3.1 10*3/uL (ref 1.4–7.7)
Neutrophils Relative %: 53.8 % (ref 43.0–77.0)
Platelets: 377 10*3/uL (ref 150.0–400.0)
RBC: 4.42 Mil/uL (ref 3.87–5.11)
RDW: 13.3 % (ref 11.5–15.5)
WBC: 5.7 10*3/uL (ref 4.0–10.5)

## 2022-08-25 LAB — LIPID PANEL
Cholesterol: 193 mg/dL (ref 0–200)
HDL: 46.1 mg/dL (ref >39.00)
LDL Cholesterol: 127 mg/dL — ABNORMAL HIGH (ref 0–99)
NonHDL: 146.62
Total CHOL/HDL Ratio: 4
Triglycerides: 99 mg/dL (ref 0.0–149.0)
VLDL: 19.8 mg/dL (ref 0.0–40.0)

## 2022-08-25 LAB — HEPATIC FUNCTION PANEL
ALT: 14 U/L (ref 0–35)
AST: 20 U/L (ref 0–37)
Albumin: 4.2 g/dL (ref 3.5–5.2)
Alkaline Phosphatase: 65 U/L (ref 39–117)
Bilirubin, Direct: 0.1 mg/dL (ref 0.0–0.3)
Total Bilirubin: 0.4 mg/dL (ref 0.2–1.2)
Total Protein: 6.8 g/dL (ref 6.0–8.3)

## 2022-08-25 LAB — BASIC METABOLIC PANEL
BUN: 15 mg/dL (ref 6–23)
CO2: 27 mEq/L (ref 19–32)
Calcium: 9.4 mg/dL (ref 8.4–10.5)
Chloride: 105 mEq/L (ref 96–112)
Creatinine, Ser: 0.87 mg/dL (ref 0.40–1.20)
GFR: 69.12 mL/min (ref >60.00)
Glucose, Bld: 91 mg/dL (ref 70–99)
Potassium: 3.9 mEq/L (ref 3.5–5.1)
Sodium: 136 mEq/L (ref 135–145)

## 2022-08-25 MED ORDER — ALBUTEROL SULFATE HFA 108 (90 BASE) MCG/ACT IN AERS
1.0000 | INHALATION_SPRAY | Freq: Four times a day (QID) | RESPIRATORY_TRACT | 1 refills | Status: DC | PRN
Start: 2022-08-25 — End: 2023-08-31

## 2022-08-25 NOTE — Progress Notes (Signed)
Established Patient Office Visit  Subjective   Patient ID: MYSTICAL STORZ, female    DOB: 07/04/1955  Age: 68 y.o. MRN: [id number]  Chief Complaint  Patient presents with   Annual Exam    HPI   Alaziah is here for complete physical.  She sees GYN yearly.  Generally doing well.  She has been very diligent with exercising on her treadmill usually walking about 30 minutes/day.  Does some resistance with dumbbells and resistance bands.  She has continued to struggle with her weight.  She has had some chronic intermittent vertigo for years and has seen neurologist.  MRI was unrevealing.  Interestingly, she has noted less vertigo when she is walking regularly on her treadmill.  Health maintenance reviewed:  -Shingrix complete -Has already received Prevnar 20 -Tetanus due 2033 -Colonoscopy due 2027 -Mammogram up-to-date -Prior hepatitis C screen negative -COVID vaccines up-to-date -She plans to get flu vaccine later this fall  Social history-she is married.  Non-smoker.  Rare alcohol.  She has 2 children and 1 grandchild who is 28 months old.  They live in [place].  Husband is looking at possible retirement later this year  Family history-father died age 50 possibly of colon cancer.  Mother died age 40 of lung cancer.  She was a heavy smoker.  She had a brother that died of alcohol complications.  Another brother died sudden death of uncertain etiology.  2 brothers and 2 sisters alive.  She has a sister with psoriasis.  Brother with prostate cancer.  Past Medical History:  Diagnosis Date   Arthritis    osteoarthritis neck   Chicken pox    Past Surgical History:  Procedure Laterality Date   MOUTH SURGERY     TUBAL LIGATION      reports that she has never smoked. She has never used smokeless tobacco. She reports current alcohol use of about 1.0 standard drink of alcohol per week. She reports that she does not use drugs. family history includes Alcohol abuse in her brother; Cancer in  her father and mother; Diabetes in her brother. Allergies  Allergen Reactions   Codeine     hives   Sulfamethoxazole-Tmp Ds [Sulfamethoxazole-Trimethoprim]     Pt is able to take medication however, she would prefer NOT to due to medication making her feel nauseated.    Review of Systems  Constitutional:  Negative for chills, fever, malaise/fatigue and weight loss.  HENT:  Negative for hearing loss.   Eyes:  Negative for blurred vision and double vision.  Respiratory:  Negative for cough and shortness of breath.   Cardiovascular:  Negative for chest pain, palpitations and leg swelling.  Gastrointestinal:  Negative for abdominal pain, blood in stool, constipation and diarrhea.  Genitourinary:  Negative for dysuria.  Musculoskeletal:  Positive for joint pain.  Skin:  Negative for rash.  Neurological:  Positive for dizziness. Negative for speech change, seizures, loss of consciousness and headaches.  Psychiatric/Behavioral:  Negative for depression.       Objective:     BP 100/60 (BP Location: Left Arm, Patient Position: Sitting, Cuff Size: Normal)   Pulse 76   Temp 98 F (36.7 C) (Oral)   Ht 5' 2.99" (1.6 m)   Wt 180 lb 12.8 oz (82 kg)   LMP 06/16/2010   SpO2 98%   BMI 32.04 kg/m  BP Readings from Last 3 Encounters:  08/25/22 100/60  10/26/21 127/76  08/17/21 (!) 146/60   Wt Readings from Last 3 Encounters:  08/25/22 180 lb 12.8 oz (82 kg)  10/26/21 176 lb 2 oz (79.9 kg)  10/03/21 165 lb (74.8 kg)      Physical Exam Vitals reviewed.  Constitutional:      General: She is not in acute distress.    Appearance: She is well-developed. She is not ill-appearing.  HENT:     Head: Normocephalic and atraumatic.  Eyes:     Pupils: Pupils are equal, round, and reactive to light.  Neck:     Thyroid: No thyromegaly.  Cardiovascular:     Rate and Rhythm: Normal rate and regular rhythm.     Heart sounds: Normal heart sounds. No murmur heard. Pulmonary:     Effort: No  respiratory distress.     Breath sounds: Normal breath sounds. No wheezing or rales.  Abdominal:     General: Bowel sounds are normal. There is no distension.     Palpations: Abdomen is soft. There is no mass.     Tenderness: There is no abdominal tenderness. There is no guarding or rebound.  Musculoskeletal:        General: Normal range of motion.     Cervical back: Normal range of motion and neck supple.     Right lower leg: No edema.     Left lower leg: No edema.  Lymphadenopathy:     Cervical: No cervical adenopathy.  Skin:    Findings: No rash.  Neurological:     Mental Status: She is alert and oriented to person, place, and time.     Cranial Nerves: No cranial nerve deficit.  Psychiatric:        Behavior: Behavior normal.        Thought Content: Thought content normal.        Judgment: Judgment normal.      No results found for any visits on 08/25/22.  Last CBC Lab Results  Component Value Date   WBC 7.4 08/10/2021   HGB 14.6 08/10/2021   HCT 43.9 08/10/2021   MCV 93.4 08/10/2021   MCH 31.1 08/10/2021   RDW 12.9 08/10/2021   PLT 421 (H) 08/10/2021   Last metabolic panel Lab Results  Component Value Date   GLUCOSE 112 (H) 08/10/2021   NA 142 08/10/2021   K 3.6 08/10/2021   CL 102 08/10/2021   CO2 25 08/10/2021   BUN 13 08/10/2021   CREATININE 1.00 08/10/2021   GFRNONAA >60 08/10/2021   CALCIUM 10.1 08/10/2021   PROT 7.3 08/10/2021   ALBUMIN 4.7 08/10/2021   BILITOT 0.5 08/10/2021   ALKPHOS 75 08/10/2021   AST 19 08/10/2021   ALT 11 08/10/2021   ANIONGAP 15 08/10/2021   Last lipids Lab Results  Component Value Date   CHOL 192 02/08/2021   HDL 51.00 02/08/2021   LDLCALC 120 (H) 02/08/2021   TRIG 104.0 02/08/2021   CHOLHDL 4 02/08/2021   Last hemoglobin A1c No results found for: "HGBA1C" Last thyroid functions Lab Results  Component Value Date   TSH 2.89 02/08/2021      The 10-year ASCVD risk score (Arnett DK, et al., 2019) is: 4.3%     Assessment & Plan:   Problem List Items Addressed This Visit   None Visit Diagnoses     Physical exam    -  Primary   Relevant Orders   Lipid panel   Basic metabolic panel   CBC with Differential/Platelet   Hepatic function panel     67 year old female who is generally doing well.  She has some intermittent chronic vertigo which is unchanged.  Still seeing GYN for mammogram and Pap smears.  -Discussed getting flu vaccine this fall -Other vaccines including pneumonia vaccine and Shingrix complete -Due for repeat colonoscopy 2027  No follow-ups on file.    Evelena Peat, MD

## 2022-08-25 NOTE — Patient Instructions (Signed)
Remember to get Flu vaccine this Fall.

## 2022-11-27 ENCOUNTER — Ambulatory Visit
Admission: RE | Admit: 2022-11-27 | Discharge: 2022-11-27 | Disposition: A | Discharge status: Home / Self Care | Payer: Medicare PPO | Source: Ambulatory Visit | Attending: Obstetrics | Admitting: Obstetrics

## 2022-11-27 DIAGNOSIS — E2839 Other primary ovarian failure: Secondary | ICD-10-CM | POA: Diagnosis not present

## 2022-11-27 DIAGNOSIS — M8580 Other specified disorders of bone density and structure, unspecified site: Secondary | ICD-10-CM

## 2022-11-27 DIAGNOSIS — N958 Other specified menopausal and perimenopausal disorders: Secondary | ICD-10-CM | POA: Diagnosis not present

## 2022-11-27 DIAGNOSIS — M8588 Other specified disorders of bone density and structure, other site: Secondary | ICD-10-CM | POA: Diagnosis not present

## 2022-12-24 ENCOUNTER — Encounter: Payer: Self-pay | Admitting: Family Medicine

## 2023-01-30 DIAGNOSIS — M8580 Other specified disorders of bone density and structure, unspecified site: Secondary | ICD-10-CM | POA: Diagnosis not present

## 2023-01-30 DIAGNOSIS — Z1331 Encounter for screening for depression: Secondary | ICD-10-CM | POA: Diagnosis not present

## 2023-01-30 DIAGNOSIS — Z124 Encounter for screening for malignant neoplasm of cervix: Secondary | ICD-10-CM | POA: Diagnosis not present

## 2023-01-30 DIAGNOSIS — Z01411 Encounter for gynecological examination (general) (routine) with abnormal findings: Secondary | ICD-10-CM | POA: Diagnosis not present

## 2023-01-30 DIAGNOSIS — Z01419 Encounter for gynecological examination (general) (routine) without abnormal findings: Secondary | ICD-10-CM | POA: Diagnosis not present

## 2023-01-30 DIAGNOSIS — N951 Menopausal and female climacteric states: Secondary | ICD-10-CM | POA: Diagnosis not present

## 2023-01-30 DIAGNOSIS — Z79890 Hormone replacement therapy (postmenopausal): Secondary | ICD-10-CM | POA: Diagnosis not present

## 2023-02-16 ENCOUNTER — Ambulatory Visit: Payer: Medicare PPO | Admitting: Family Medicine

## 2023-02-16 ENCOUNTER — Encounter: Payer: Self-pay | Admitting: Family Medicine

## 2023-02-16 VITALS — BP 150/86 | HR 80 | Temp 97.9°F | Wt 179.9 lb

## 2023-02-16 DIAGNOSIS — H6121 Impacted cerumen, right ear: Secondary | ICD-10-CM | POA: Diagnosis not present

## 2023-02-16 DIAGNOSIS — R932 Abnormal findings on diagnostic imaging of liver and biliary tract: Secondary | ICD-10-CM | POA: Diagnosis not present

## 2023-02-16 NOTE — Progress Notes (Signed)
 Established Patient Office Visit  Subjective   Patient ID: Donna Shaw, female    DOB: 16-Oct-1955  Age: 68 y.o. MRN: [id number]  Chief Complaint  Patient presents with   Results   Ear Pain    HPI   Eydie is seen for recent abnormal [facility] screening.  She states she set this up because she has a brother that was diagnosed with carotid disease.  Josely's testing was all basically normal with exception of suspected abnormality has been identified with her liver.  No abdominal aortic aneurysm.  Her report was very vague and nonspecific with regard to the liver abnormality.  She was told to follow-up with her primary.  She denies any recent abdominal pain.  No change in urine or stool color.  Denies any nausea, vomiting, or diarrhea.  Appetite and weight stable.  No fever.  Letia had hepatic panel back in August which was normal.  She also relates some mild ear fullness and discomfort recently over the past week.  No dizziness.  Has had vertigo in the past but none currently.  No ear drainage.  Past Medical History:  Diagnosis Date   Arthritis    osteoarthritis neck   Chicken pox    Past Surgical History:  Procedure Laterality Date   MOUTH SURGERY     TUBAL LIGATION      reports that she has never smoked. She has never used smokeless tobacco. She reports current alcohol use of about 1.0 standard drink of alcohol per week. She reports that she does not use drugs. family history includes Alcohol abuse in her brother; Cancer in her father and mother; Diabetes in her brother. Allergies  Allergen Reactions   Codeine     hives   Sulfamethoxazole-Tmp Ds [Sulfamethoxazole-Trimethoprim]     Pt is able to take medication however, she would prefer NOT to due to medication making her feel nauseated.    Review of Systems  Constitutional:  Negative for chills, fever and malaise/fatigue.  Eyes:  Negative for blurred vision.  Respiratory:  Negative for shortness of breath.    Cardiovascular:  Negative for chest pain.  Neurological:  Negative for dizziness, weakness and headaches.      Objective:     BP (!) 150/86 (BP Location: Left Arm, Patient Position: Sitting, Cuff Size: Normal)   Pulse 80   Temp 97.9 F (36.6 C) (Oral)   Wt 179 lb 14.4 oz (81.6 kg)   LMP 06/16/2010   SpO2 98%   BMI 31.88 kg/m  BP Readings from Last 3 Encounters:  02/16/23 (!) 150/86  08/25/22 100/60  10/26/21 127/76   Wt Readings from Last 3 Encounters:  02/16/23 179 lb 14.4 oz (81.6 kg)  08/25/22 180 lb 12.8 oz (82 kg)  10/26/21 176 lb 2 oz (79.9 kg)      Physical Exam Vitals reviewed.  Constitutional:      Appearance: She is well-developed.  HENT:     Ears:     Comments: Small amount of cerumen right canal easily removed with curette.  Eardrum appears normal with no acute findings. Eyes:     Pupils: Pupils are equal, round, and reactive to light.  Neck:     Thyroid : No thyromegaly.     Vascular: No JVD.  Cardiovascular:     Rate and Rhythm: Normal rate and regular rhythm.     Heart sounds:     No gallop.  Pulmonary:     Effort: Pulmonary effort is normal. No respiratory  distress.     Breath sounds: Normal breath sounds. No wheezing or rales.  Abdominal:     Comments: Soft and nontender.  No hepatomegaly.  No abdominal tenderness.  No mass.   Musculoskeletal:     Cervical back: Neck supple.  Neurological:     Mental Status: She is alert.      No results found for any visits on 02/16/23.  Last CBC Lab Results  Component Value Date   WBC 5.7 08/25/2022   HGB 13.7 08/25/2022   HCT 42.0 08/25/2022   MCV 95.0 08/25/2022   MCH 31.1 08/10/2021   RDW 13.3 08/25/2022   PLT 377.0 08/25/2022   Last metabolic panel Lab Results  Component Value Date   GLUCOSE 91 08/25/2022   NA 136 08/25/2022   K 3.9 08/25/2022   CL 105 08/25/2022   CO2 27 08/25/2022   BUN 15 08/25/2022   CREATININE 0.87 08/25/2022   GFR 69.12 08/25/2022   CALCIUM 9.4 08/25/2022    PROT 6.8 08/25/2022   ALBUMIN 4.2 08/25/2022   BILITOT 0.4 08/25/2022   ALKPHOS 65 08/25/2022   AST 20 08/25/2022   ALT 14 08/25/2022   ANIONGAP 15 08/10/2021   Last lipids Lab Results  Component Value Date   CHOL 193 08/25/2022   HDL 46.10 08/25/2022   LDLCALC 127 (H) 08/25/2022   TRIG 99.0 08/25/2022   CHOLHDL 4 08/25/2022   Last hemoglobin A1c No results found for: HGBA1C Last thyroid  functions Lab Results  Component Value Date   TSH 2.89 02/08/2021      The 10-year ASCVD risk score (Arnett DK, et al., 2019) is: 9.7%    Assessment & Plan:   Problem List Items Addressed This Visit   None Visit Diagnoses       Abnormal finding on imaging of liver    -  Primary   Relevant Orders   US  Abdomen Limited RUQ (LIVER/GB)     Nonspecific finding on recent [facility] screening for abdominal aortic aneurysm.  Her report was very vague.  Will set up limited ultrasound abdomen of the right upper quadrant to further assess.  We did discuss the fact that there are multiple incidental findings frequently on liver imaging with benign findings such as cyst and hemangiomas.  She does not have any worrisome physical findings or symptoms at this time  She did also have some cerumen impaction right canal.  She has had difficulties with vertigo previously with irrigation.  She requested attempted extraction with curette.  Patient aware of risk including bleeding, pain, low risk of eardrum perforation.  She consented.  We used a curette and able to remove cerumen from right canal without difficulty.  Patient tolerated well.  No follow-ups on file.    Wolm Scarlet, MD

## 2023-02-27 ENCOUNTER — Ambulatory Visit (HOSPITAL_BASED_OUTPATIENT_CLINIC_OR_DEPARTMENT_OTHER): Payer: Medicare PPO

## 2023-02-27 ENCOUNTER — Ambulatory Visit (HOSPITAL_BASED_OUTPATIENT_CLINIC_OR_DEPARTMENT_OTHER)
Admission: RE | Admit: 2023-02-27 | Discharge: 2023-02-27 | Disposition: A | Discharge status: Home / Self Care | Payer: Medicare PPO | Source: Ambulatory Visit | Attending: Family Medicine | Admitting: Family Medicine

## 2023-02-27 DIAGNOSIS — R932 Abnormal findings on diagnostic imaging of liver and biliary tract: Secondary | ICD-10-CM | POA: Insufficient documentation

## 2023-02-27 DIAGNOSIS — K7689 Other specified diseases of liver: Secondary | ICD-10-CM | POA: Diagnosis not present

## 2023-05-01 ENCOUNTER — Other Ambulatory Visit: Payer: Self-pay | Admitting: Obstetrics

## 2023-05-01 DIAGNOSIS — Z1231 Encounter for screening mammogram for malignant neoplasm of breast: Secondary | ICD-10-CM

## 2023-05-29 ENCOUNTER — Encounter: Payer: Self-pay | Admitting: Family Medicine

## 2023-06-14 ENCOUNTER — Ambulatory Visit
Admission: RE | Admit: 2023-06-14 | Discharge: 2023-06-14 | Disposition: A | Discharge status: Home / Self Care | Source: Ambulatory Visit | Attending: Obstetrics | Admitting: Obstetrics

## 2023-06-14 ENCOUNTER — Ambulatory Visit

## 2023-06-14 DIAGNOSIS — Z1231 Encounter for screening mammogram for malignant neoplasm of breast: Secondary | ICD-10-CM | POA: Diagnosis not present

## 2023-08-31 ENCOUNTER — Telehealth (INDEPENDENT_AMBULATORY_CARE_PROVIDER_SITE_OTHER): Admitting: Family Medicine

## 2023-08-31 ENCOUNTER — Encounter: Payer: Self-pay | Admitting: Family Medicine

## 2023-08-31 DIAGNOSIS — U071 COVID-19: Secondary | ICD-10-CM | POA: Diagnosis not present

## 2023-08-31 MED ORDER — ALBUTEROL SULFATE HFA 108 (90 BASE) MCG/ACT IN AERS
1.0000 | INHALATION_SPRAY | Freq: Four times a day (QID) | RESPIRATORY_TRACT | 1 refills | Status: AC | PRN
Start: 2023-08-31 — End: ?

## 2023-08-31 MED ORDER — MOLNUPIRAVIR EUA 200MG CAPSULE: 4.0000 | ORAL_CAPSULE | Freq: Two times a day (BID) | ORAL | 0 refills | Status: AC

## 2023-08-31 NOTE — Progress Notes (Signed)
 Patient ID: Donna Shaw, female   DOB: Oct 22, 1955, 68 y.o.   MRN: [id number]  Virtual Visit via Video Note  I connected with Devere Altes on 08/31/23 at  2:00 PM EDT by a video enabled telemedicine application and verified that I am speaking with the correct person using two identifiers.  Location patient: home Location provider:work or home office Persons participating in the virtual visit: patient, provider  I discussed the limitations of evaluation and management by telemedicine and the availability of in person appointments. The patient expressed understanding and agreed to proceed.   HPI:  Emmilee woke up this morning with mild sore throat and cough.  Her husband had tested positive for COVID earlier today.  She did test which came back positive as well.  Her symptoms are relatively mild thus far.  She and her husband both had COVID booster back in June on the ninth.  She denies any nausea, vomiting, or diarrhea.  No cardiovascular or chronic lung disease history.  She has responded well in the past to molnupiravir  and requesting refill.  She does have history to mild reactive airway issues and requesting refill of Ventolin   ROS: See pertinent positives and negatives per HPI.  Past Medical History:  Diagnosis Date   Arthritis    osteoarthritis neck   Chicken pox     Past Surgical History:  Procedure Laterality Date   BREAST BIOPSY Left 2014   MOUTH SURGERY     TUBAL LIGATION      Family History  Problem Relation Age of Onset   Cancer Mother        lung   Cancer Father        colon   Alcohol abuse Brother    Diabetes Brother     SOCIAL HX: Non-smoker   Current Outpatient Medications:    estradiol (CLIMARA - DOSED IN MG/24 HR) 0.025 mg/24hr patch, 0.025 mg once a week., Disp: , Rfl:    meclizine  (ANTIVERT ) 25 MG tablet, Take 1 tablet (25 mg total) by mouth 3 (three) times daily as needed for dizziness., Disp: 30 tablet, Rfl: 0   molnupiravir  EUA (LAGEVRIO ) 200 mg  CAPS capsule, Take 4 capsules (800 mg total) by mouth 2 (two) times daily for 5 days., Disp: 40 capsule, Rfl: 0   progesterone (PROMETRIUM) 100 MG capsule, Take 100 mg by mouth daily. , Disp: , Rfl:    Ranitidine HCl (ZANTAC PO), Take by mouth. As needed, Disp: , Rfl:    albuterol  (VENTOLIN  HFA) 108 (90 Base) MCG/ACT inhaler, Inhale 1-2 puffs into the lungs every 6 (six) hours as needed for wheezing or shortness of breath., Disp: 8.5 each, Rfl: 1  EXAM:  VITALS per patient if applicable:  GENERAL: alert, oriented, appears well and in no acute distress  HEENT: atraumatic, conjunttiva clear, no obvious abnormalities on inspection of external nose and ears  NECK: normal movements of the head and neck  LUNGS: on inspection no signs of respiratory distress, breathing rate appears normal, no obvious gross SOB, gasping or wheezing  CV: no obvious cyanosis  MS: moves all visible extremities without noticeable abnormality  PSYCH/NEURO: pleasant and cooperative, no obvious depression or anxiety, speech and thought processing grossly intact  ASSESSMENT AND PLAN:  Discussed the following assessment and plan:  COVID-19.  Patient has relatively mild symptoms currently.  She is requesting refill of molnupiravir  and this will be sent in.  She is on day 1 of symptoms.  We also send in refill of  Ventolin  multidose inhaler 2 puffs every 4-6 hours as needed. Follow up for any persistent or worsening symptoms.      I discussed the assessment and treatment plan with the patient. The patient was provided an opportunity to ask questions and all were answered. The patient agreed with the plan and demonstrated an understanding of the instructions.   The patient was advised to call back or seek an in-person evaluation if the symptoms worsen or if the condition fails to improve as anticipated.     Wolm Scarlet, MD

## 2023-09-07 ENCOUNTER — Encounter: Admitting: Family Medicine

## 2023-09-14 ENCOUNTER — Encounter: Payer: Self-pay | Admitting: Family Medicine

## 2023-09-14 ENCOUNTER — Ambulatory Visit: Admitting: Family Medicine

## 2023-09-14 VITALS — BP 134/74 | HR 83 | Temp 98.1°F | Ht 62.21 in | Wt 161.0 lb

## 2023-09-14 DIAGNOSIS — Z0000 Encounter for general adult medical examination without abnormal findings: Secondary | ICD-10-CM

## 2023-09-14 LAB — BASIC METABOLIC PANEL WITH GFR
BUN: 16 mg/dL (ref 6–23)
CO2: 27 meq/L (ref 19–32)
Calcium: 9.2 mg/dL (ref 8.4–10.5)
Chloride: 105 meq/L (ref 96–112)
Creatinine, Ser: 0.86 mg/dL (ref 0.40–1.20)
GFR: 69.56 mL/min (ref >60.00)
Glucose, Bld: 88 mg/dL (ref 70–99)
Potassium: 4.3 meq/L (ref 3.5–5.1)
Sodium: 141 meq/L (ref 135–145)

## 2023-09-14 LAB — LIPID PANEL
Cholesterol: 191 mg/dL (ref 0–200)
HDL: 51.3 mg/dL (ref >39.00)
LDL Cholesterol: 122 mg/dL — ABNORMAL HIGH (ref 0–99)
NonHDL: 139.69
Total CHOL/HDL Ratio: 4
Triglycerides: 90 mg/dL (ref 0.0–149.0)
VLDL: 18 mg/dL (ref 0.0–40.0)

## 2023-09-14 LAB — HEPATIC FUNCTION PANEL
ALT: 10 U/L (ref 0–35)
AST: 18 U/L (ref 0–37)
Albumin: 4.2 g/dL (ref 3.5–5.2)
Alkaline Phosphatase: 63 U/L (ref 39–117)
Bilirubin, Direct: 0.1 mg/dL (ref 0.0–0.3)
Total Bilirubin: 0.5 mg/dL (ref 0.2–1.2)
Total Protein: 7 g/dL (ref 6.0–8.3)

## 2023-09-14 LAB — CBC WITH DIFFERENTIAL/PLATELET
Basophils Absolute: 0.2 K/uL — ABNORMAL HIGH (ref 0.0–0.1)
Basophils Relative: 2.8 % (ref 0.0–3.0)
Eosinophils Absolute: 0.1 K/uL (ref 0.0–0.7)
Eosinophils Relative: 2.3 % (ref 0.0–5.0)
HCT: 40.2 % (ref 36.0–46.0)
Hemoglobin: 13.5 g/dL (ref 12.0–15.0)
Lymphocytes Relative: 29.3 % (ref 12.0–46.0)
Lymphs Abs: 1.6 K/uL (ref 0.7–4.0)
MCHC: 33.6 g/dL (ref 30.0–36.0)
MCV: 92.8 fl (ref 78.0–100.0)
Monocytes Absolute: 0.5 K/uL (ref 0.1–1.0)
Monocytes Relative: 9.1 % (ref 3.0–12.0)
Neutro Abs: 3.1 K/uL (ref 1.4–7.7)
Neutrophils Relative %: 56.5 % (ref 43.0–77.0)
Platelets: 382 K/uL (ref 150.0–400.0)
RBC: 4.34 Mil/uL (ref 3.87–5.11)
RDW: 13.1 % (ref 11.5–15.5)
WBC: 5.5 K/uL (ref 4.0–10.5)

## 2023-09-14 MED ORDER — METHOCARBAMOL 500 MG PO TABS: 500.0000 mg | ORAL_TABLET | Freq: Three times a day (TID) | ORAL | 0 refills | Status: AC | PRN

## 2023-09-14 MED ORDER — MECLIZINE HCL 25 MG PO TABS
25.0000 mg | ORAL_TABLET | Freq: Three times a day (TID) | ORAL | 0 refills | Status: AC | PRN
Start: 2023-09-14 — End: ?

## 2023-09-14 NOTE — Patient Instructions (Signed)
Remember flu vaccine this Fall.   

## 2023-09-14 NOTE — Progress Notes (Signed)
 Established Patient Office Visit  Subjective   Patient ID: Donna Shaw, female    DOB: 1955/07/16  Age: 68 y.o. MRN: [id number]  Chief Complaint  Patient presents with   Annual Exam    HPI   Donna Shaw is seen for physical exam.  She sees gynecologist yearly.  She has done tremendous job this past year with diet modification predominantly with reducing sugars and has lost about 20 pounds.  She still walking regularly on a treadmill.  She had scaled back her sugar intake predominantly to try to help with arthritis issues and she feels like this is helping some.  She sees GYN and remains on Climara patch along with progesterone.  She had DEXA scan a year ago which showed osteopenia.  She still has some balance issues and intermittent vertigo  Health maintenance reviewed:  Health Maintenance  Topic Date Due   Influenza Vaccine  08/10/2023   Medicare Annual Wellness (AWV)  08/25/2023   COVID-19 Vaccine (6 - 2025-26 season) 09/10/2023   MAMMOGRAM  06/13/2025   Colonoscopy  12/24/2030   DTaP/Tdap/Td (3 - Td or Tdap) 02/09/2031   Pneumococcal Vaccine: 50+ Years  Completed   DEXA SCAN  Completed   Hepatitis C Screening  Completed   Zoster Vaccines- Shingrix  Completed   HPV VACCINES  Aged Out   Meningococcal B Vaccine  Aged Out   - She plans to get flu vaccine later this fall - Has had prior RSV vaccine and other vaccines up-to-date Social history-she is married.  Non-smoker.  Rare alcohol.  She has 2 children and 1 grandchild who is 33-1/2 years old.  They live in [place].  Husband is looking at possible retirement later this year   Family history-father died age 26 possibly of colon cancer.  Mother died age 68 of lung cancer.  She was a heavy smoker.  She had a brother that died of alcohol complications.  Another brother died sudden death of uncertain etiology.  2 brothers and 2 sisters alive.  She has a sister with psoriasis.  Brother with prostate cancer.  Past Medical History:   Diagnosis Date   Arthritis    osteoarthritis neck   Chicken pox    Past Surgical History:  Procedure Laterality Date   BREAST BIOPSY Left 2014   MOUTH SURGERY     TUBAL LIGATION      reports that she has never smoked. She has never used smokeless tobacco. She reports current alcohol use of about 1.0 standard drink of alcohol per week. She reports that she does not use drugs. family history includes Alcohol abuse in her brother; Cancer in her father and mother; Diabetes in her brother. Allergies  Allergen Reactions   Codeine     hives   Sulfamethoxazole-Tmp Ds [Sulfamethoxazole-Trimethoprim]     Pt is able to take medication however, she would prefer NOT to due to medication making her feel nauseated.     Review of Systems  Constitutional:  Negative for chills, fever, malaise/fatigue and weight loss.  HENT:  Negative for hearing loss.   Eyes:  Negative for blurred vision and double vision.  Respiratory:  Negative for cough and shortness of breath.   Cardiovascular:  Negative for chest pain, palpitations and leg swelling.  Gastrointestinal:  Negative for abdominal pain, blood in stool, constipation and diarrhea.  Genitourinary:  Negative for dysuria.  Skin:  Negative for rash.  Neurological:  Negative for dizziness, speech change, seizures, loss of consciousness and headaches.  Psychiatric/Behavioral:  Negative for depression.       Objective:     BP 134/74   Pulse 83   Temp 98.1 F (36.7 C) (Oral)   Ht 5' 2.21 (1.58 m)   Wt 161 lb (73 kg)   LMP 06/16/2010   SpO2 96%   BMI 29.25 kg/m  BP Readings from Last 3 Encounters:  09/14/23 134/74  02/16/23 (!) 150/86  08/25/22 100/60   Wt Readings from Last 3 Encounters:  09/14/23 161 lb (73 kg)  02/16/23 179 lb 14.4 oz (81.6 kg)  08/25/22 180 lb 12.8 oz (82 kg)      Physical Exam Vitals reviewed.  Constitutional:      General: She is not in acute distress.    Appearance: She is well-developed.  Eyes:      Pupils: Pupils are equal, round, and reactive to light.  Neck:     Thyroid : No thyromegaly.     Vascular: No JVD.  Cardiovascular:     Rate and Rhythm: Normal rate and regular rhythm.     Heart sounds:     No gallop.  Pulmonary:     Effort: Pulmonary effort is normal. No respiratory distress.     Breath sounds: Normal breath sounds. No wheezing or rales.  Abdominal:     Palpations: Abdomen is soft. There is no mass.     Tenderness: There is no abdominal tenderness.  Musculoskeletal:     Cervical back: Neck supple.     Right lower leg: No edema.     Left lower leg: No edema.  Neurological:     General: No focal deficit present.     Mental Status: She is alert.      No results found for any visits on 09/14/23.    The 10-year ASCVD risk score (Arnett DK, et al., 2019) is: 8.7%    Assessment & Plan:   Problem List Items Addressed This Visit   None Visit Diagnoses       Physical exam    -  Primary   Relevant Orders   Basic metabolic panel with GFR   Lipid panel   CBC with Differential/Platelet   Hepatic function panel     68 year old female here for physical exam.  She has done tremendous job with dietary change and weight loss over the past year lost about 20 pounds total.  We discussed following health maintenance items  -Flu vaccine recommended and she plans to get this in about a month - Other vaccines up-to-date - Continue regular weightbearing exercise along with daily calcium and vitamin D  with her osteopenia history - Colonoscopy up-to-date but will be due for repeat 2027 - She plans to continue GYN follow-up for Pap smears and mammograms  No follow-ups on file.    Wolm Scarlet, MD

## 2023-09-15 ENCOUNTER — Ambulatory Visit: Payer: Self-pay | Admitting: Family Medicine

## 2023-12-17 ENCOUNTER — Ambulatory Visit: Payer: Self-pay

## 2023-12-17 ENCOUNTER — Ambulatory Visit: Admitting: Family Medicine

## 2023-12-17 NOTE — Telephone Encounter (Signed)
 1st attempt at calling patient back--no answer--left a voicemail for patient to call back                  Copied from CRM #[id number]. Topic: Clinical - Red Word Triage >> Dec 17, 2023  8:23 AM Frederich PARAS wrote: [facility] that prompted transfer to Nurse Triage: productive cough  Green mucus, no energy, little eating, coughing, no pain

## 2023-12-17 NOTE — Telephone Encounter (Signed)
 FYI Only or Action Required?: FYI only for provider: appointment scheduled on 12/17/23.  Patient was last seen in primary care on 09/14/2023 by Micheal Wolm ORN, MD.  Called Nurse Triage reporting Cough and Fatigue.  Symptoms began several months ago.  Interventions attempted: OTC medications: Dayquil, Nyquil.  Symptoms are: gradually worsening.  Triage Disposition: See Physician Within 24 Hours  Patient/caregiver understands and will follow disposition?: Yes           Reason for Disposition  SEVERE coughing spells (e.g., whooping sound after coughing, vomiting after coughing)  Answer Assessment - Initial Assessment Questions 1. ONSET: When did the cough begin?      October- dry cough, over the past week worsening and became productive.  2. SEVERITY: How bad is the cough today?      Severe coughing spells and states it sounds like croup.  3. SPUTUM: Describe the color of your sputum (e.g., none, dry cough; clear, white, yellow, green)     Started white and has gone to yellow-green.  4. HEMOPTYSIS: Are you coughing up any blood? If so ask: How much? (e.g., flecks, streaks, tablespoons, etc.)     No.  5. DIFFICULTY BREATHING: Are you having difficulty breathing? If Yes, ask: How bad is it? (e.g., mild, moderate, severe)      Yes, at night when lying down for rest. With severe coughing spells at night and when waking up in the morning.  6. FEVER: Do you have a fever? If Yes, ask: What is your temperature, how was it measured, and when did it start?     No.  7. CARDIAC HISTORY: Do you have any history of heart disease? (e.g., heart attack, congestive heart failure)      No.  8. LUNG HISTORY: Do you have any history of lung disease?  (e.g., pulmonary embolus, asthma, emphysema)     No.  9. PE RISK FACTORS: Do you have a history of blood clots? (or: recent major surgery, recent prolonged travel, bedridden)     No.  10. OTHER SYMPTOMS: Do you  have any other symptoms? (e.g., runny nose, wheezing, chest pain)       Sore throat, nasal discharge (yellow to green and bloody).  Protocols used: Cough - Chronic-A-AH

## 2023-12-18 ENCOUNTER — Encounter: Payer: Self-pay | Admitting: Family Medicine

## 2023-12-18 ENCOUNTER — Ambulatory Visit: Admitting: Family Medicine

## 2023-12-18 ENCOUNTER — Ambulatory Visit

## 2023-12-18 VITALS — BP 116/70 | HR 100 | Temp 97.7°F | Wt 152.0 lb

## 2023-12-18 DIAGNOSIS — R053 Chronic cough: Secondary | ICD-10-CM

## 2023-12-18 DIAGNOSIS — R634 Abnormal weight loss: Secondary | ICD-10-CM

## 2023-12-18 MED ORDER — AMOXICILLIN-POT CLAVULANATE 875-125 MG PO TABS: 1.0000 | ORAL_TABLET | Freq: Two times a day (BID) | ORAL | 0 refills | Status: AC

## 2023-12-18 NOTE — Patient Instructions (Signed)
 Follow up for any fever or increased shortness of breath.

## 2023-12-18 NOTE — Progress Notes (Signed)
 Established Patient Office Visit  Subjective   Patient ID: Donna Shaw, female    DOB: 03-Jun-1955  Age: 68 y.o. MRN: [id number]  Chief Complaint  Patient presents with   Cough   Nasal Congestion   Sore Throat    HPI   Donna Shaw is a non-smoker who is seen with about 6-week history of cough.  Initially started off dry.  She states every couple years she tends to have prolonged bronchial infection.  She was around grandchild who is sick around Thanksgiving and feels like her cough has been worse since then.  She has had some productive yellow-green sputum since then along with sore throat for couple weeks.  She has had some sinus congestion and postnasal drip with bloody colored nasal discharge frequently.  No documented fever.  No hemoptysis.  Long standing history of secondary smoke around parents and several siblings growing up.  She has had some recent weight loss but this has been intentional.  Appetite is stable.  Denies any obvious wheezing or significant dyspnea.  No active GERD symptoms.  No ACE inhibitor use.  Past Medical History:  Diagnosis Date   Arthritis    osteoarthritis neck   Chicken pox    Past Surgical History:  Procedure Laterality Date   BREAST BIOPSY Left 2014   MOUTH SURGERY     TUBAL LIGATION      reports that she has never smoked. She has never used smokeless tobacco. She reports current alcohol use of about 1.0 standard drink of alcohol per week. She reports that she does not use drugs. family history includes Alcohol abuse in her brother; Cancer in her father and mother; Diabetes in her brother. Allergies  Allergen Reactions   Codeine     hives   Sulfamethoxazole-Tmp Ds [Sulfamethoxazole-Trimethoprim]     Pt is able to take medication however, she would prefer NOT to due to medication making her feel nauseated.    Review of Systems  Constitutional:  Negative for chills and fever.  HENT:  Positive for congestion.   Respiratory:  Positive for  cough and sputum production. Negative for hemoptysis and wheezing.   Cardiovascular:  Negative for chest pain.      Objective:     BP 116/70   Pulse 100   Temp 97.7 F (36.5 C) (Oral)   Wt 152 lb (68.9 kg)   LMP 06/16/2010   SpO2 98%   BMI 27.62 kg/m  BP Readings from Last 3 Encounters:  12/18/23 116/70  09/14/23 134/74  02/16/23 (!) 150/86   Wt Readings from Last 3 Encounters:  12/18/23 152 lb (68.9 kg)  09/14/23 161 lb (73 kg)  02/16/23 179 lb 14.4 oz (81.6 kg)      Physical Exam Vitals reviewed.  Constitutional:      General: She is not in acute distress.    Appearance: She is not ill-appearing.  HENT:     Ears:     Comments: She has some cerumen in both canals which is obstructing full view of TMs    Mouth/Throat:     Mouth: Mucous membranes are moist.     Pharynx: Oropharynx is clear. No oropharyngeal exudate or posterior oropharyngeal erythema.  Cardiovascular:     Rate and Rhythm: Normal rate and regular rhythm.  Pulmonary:     Effort: Pulmonary effort is normal. No respiratory distress.     Breath sounds: Normal breath sounds. No wheezing or rales.  Neurological:     Mental Status: She  is alert.      No results found for any visits on 12/18/23.    The 10-year ASCVD risk score (Arnett DK, et al., 2019) is: 6.4%    Assessment & Plan:   Donna Shaw is seen with several week (6+) history of some productive cough and sinus congestive symptoms.  She has had some recent weight loss but states this is intentional.  Denies any dyspnea.  Afebrile.  May have concomitant sinusitis triggering persistent cough  -Given duration of symptoms obtain PA and lateral chest x-ray - Given duration of cough and likely chronic sinusitis symptoms we elected to go and start Augmentin  875 mg twice daily for 10 days. - Be in touch if cough not improving over the next couple weeks.   No follow-ups on file.    Wolm Scarlet, MD

## 2023-12-24 ENCOUNTER — Ambulatory Visit: Payer: Self-pay | Admitting: Family Medicine
